# Patient Record
Sex: Female | Born: 1950 | Race: White | Hispanic: No | Marital: Married | Smoking: Former smoker
Health system: Southern US, Community
[De-identification: ages and names within clinical notes are randomized; demographics above are authoritative.]

## PROBLEM LIST (undated history)

## (undated) DIAGNOSIS — I341 Nonrheumatic mitral (valve) prolapse: Secondary | ICD-10-CM

## (undated) DIAGNOSIS — R87619 Unspecified abnormal cytological findings in specimens from cervix uteri: Secondary | ICD-10-CM

## (undated) DIAGNOSIS — IMO0002 Reserved for concepts with insufficient information to code with codable children: Secondary | ICD-10-CM

## (undated) HISTORY — PX: COLONOSCOPY: SHX174

## (undated) HISTORY — PX: ABDOMINAL HYSTERECTOMY: SHX81

## (undated) HISTORY — PX: DILATATION & CURRETTAGE/HYSTEROSCOPY WITH RESECTOCOPE: SHX5572

## (undated) HISTORY — PX: BUNIONECTOMY: SHX129

## (undated) HISTORY — PX: DILATION AND CURETTAGE OF UTERUS: SHX78

---

## 1998-06-14 ENCOUNTER — Ambulatory Visit (HOSPITAL_COMMUNITY): Admission: RE | Admit: 1998-06-14 | Discharge: 1998-06-14 | Payer: Self-pay | Admitting: Obstetrics & Gynecology

## 1999-06-21 ENCOUNTER — Ambulatory Visit (HOSPITAL_COMMUNITY): Admission: RE | Admit: 1999-06-21 | Discharge: 1999-06-21 | Payer: Self-pay | Admitting: Obstetrics & Gynecology

## 1999-06-21 ENCOUNTER — Encounter: Payer: Self-pay | Admitting: Obstetrics & Gynecology

## 1999-06-23 ENCOUNTER — Ambulatory Visit (HOSPITAL_COMMUNITY): Admission: RE | Admit: 1999-06-23 | Discharge: 1999-06-23 | Payer: Self-pay | Admitting: Obstetrics & Gynecology

## 1999-06-23 ENCOUNTER — Encounter: Payer: Self-pay | Admitting: Obstetrics & Gynecology

## 2000-07-30 ENCOUNTER — Encounter: Payer: Self-pay | Admitting: Obstetrics & Gynecology

## 2000-07-30 ENCOUNTER — Ambulatory Visit (HOSPITAL_COMMUNITY): Admission: RE | Admit: 2000-07-30 | Discharge: 2000-07-30 | Payer: Self-pay | Admitting: Obstetrics & Gynecology

## 2000-09-18 ENCOUNTER — Other Ambulatory Visit: Admission: RE | Admit: 2000-09-18 | Discharge: 2000-09-18 | Payer: Self-pay | Admitting: Obstetrics & Gynecology

## 2001-08-01 ENCOUNTER — Encounter: Payer: Self-pay | Admitting: Obstetrics & Gynecology

## 2001-08-01 ENCOUNTER — Ambulatory Visit (HOSPITAL_COMMUNITY): Admission: RE | Admit: 2001-08-01 | Discharge: 2001-08-01 | Payer: Self-pay | Admitting: Obstetrics & Gynecology

## 2001-08-07 ENCOUNTER — Encounter: Payer: Self-pay | Admitting: Obstetrics & Gynecology

## 2001-08-07 ENCOUNTER — Encounter: Admission: RE | Admit: 2001-08-07 | Discharge: 2001-08-07 | Payer: Self-pay | Admitting: Obstetrics & Gynecology

## 2002-01-06 ENCOUNTER — Other Ambulatory Visit: Admission: RE | Admit: 2002-01-06 | Discharge: 2002-01-06 | Payer: Self-pay | Admitting: Obstetrics & Gynecology

## 2002-08-11 ENCOUNTER — Encounter: Payer: Self-pay | Admitting: Obstetrics & Gynecology

## 2002-08-11 ENCOUNTER — Ambulatory Visit (HOSPITAL_COMMUNITY): Admission: RE | Admit: 2002-08-11 | Discharge: 2002-08-11 | Payer: Self-pay | Admitting: Obstetrics & Gynecology

## 2003-01-01 ENCOUNTER — Encounter: Admission: RE | Admit: 2003-01-01 | Discharge: 2003-01-01 | Payer: Self-pay | Admitting: Obstetrics & Gynecology

## 2003-01-01 ENCOUNTER — Encounter: Payer: Self-pay | Admitting: Obstetrics & Gynecology

## 2003-01-27 ENCOUNTER — Other Ambulatory Visit: Admission: RE | Admit: 2003-01-27 | Discharge: 2003-01-27 | Payer: Self-pay | Admitting: Obstetrics & Gynecology

## 2003-08-19 ENCOUNTER — Ambulatory Visit (HOSPITAL_COMMUNITY): Admission: RE | Admit: 2003-08-19 | Discharge: 2003-08-19 | Payer: Self-pay | Admitting: Obstetrics & Gynecology

## 2004-01-27 ENCOUNTER — Other Ambulatory Visit: Admission: RE | Admit: 2004-01-27 | Discharge: 2004-01-27 | Payer: Self-pay | Admitting: Obstetrics & Gynecology

## 2004-09-12 ENCOUNTER — Ambulatory Visit (HOSPITAL_COMMUNITY): Admission: RE | Admit: 2004-09-12 | Discharge: 2004-09-12 | Payer: Self-pay | Admitting: Obstetrics & Gynecology

## 2005-02-13 ENCOUNTER — Other Ambulatory Visit: Admission: RE | Admit: 2005-02-13 | Discharge: 2005-02-13 | Payer: Self-pay | Admitting: Obstetrics & Gynecology

## 2005-02-28 ENCOUNTER — Encounter (INDEPENDENT_AMBULATORY_CARE_PROVIDER_SITE_OTHER): Payer: Self-pay | Admitting: *Deleted

## 2005-02-28 ENCOUNTER — Ambulatory Visit (HOSPITAL_COMMUNITY): Admission: RE | Admit: 2005-02-28 | Discharge: 2005-02-28 | Payer: Self-pay | Admitting: Obstetrics & Gynecology

## 2005-09-14 ENCOUNTER — Ambulatory Visit (HOSPITAL_COMMUNITY): Admission: RE | Admit: 2005-09-14 | Discharge: 2005-09-14 | Payer: Self-pay | Admitting: Obstetrics & Gynecology

## 2006-09-17 ENCOUNTER — Ambulatory Visit (HOSPITAL_COMMUNITY): Admission: RE | Admit: 2006-09-17 | Discharge: 2006-09-17 | Payer: Self-pay | Admitting: Obstetrics & Gynecology

## 2007-09-23 ENCOUNTER — Ambulatory Visit (HOSPITAL_COMMUNITY): Admission: RE | Admit: 2007-09-23 | Discharge: 2007-09-23 | Payer: Self-pay | Admitting: Obstetrics & Gynecology

## 2008-09-23 ENCOUNTER — Ambulatory Visit (HOSPITAL_COMMUNITY): Admission: RE | Admit: 2008-09-23 | Discharge: 2008-09-23 | Payer: Self-pay | Admitting: Obstetrics & Gynecology

## 2009-07-17 IMAGING — MG MM DIGITAL SCREENING BILAT
4 series · 4 of 4 positions shown · non-contrast
Comparison: none

DG SCREEN MAMMOGRAM BILATERAL
Bilateral CC and MLO view(s) were taken.

DIGITAL SCREENING MAMMOGRAM WITH CAD:
The breast tissue is heterogeneously dense.  No masses or malignant type calcifications are 
identified.  Compared with prior studies.

[R CC]
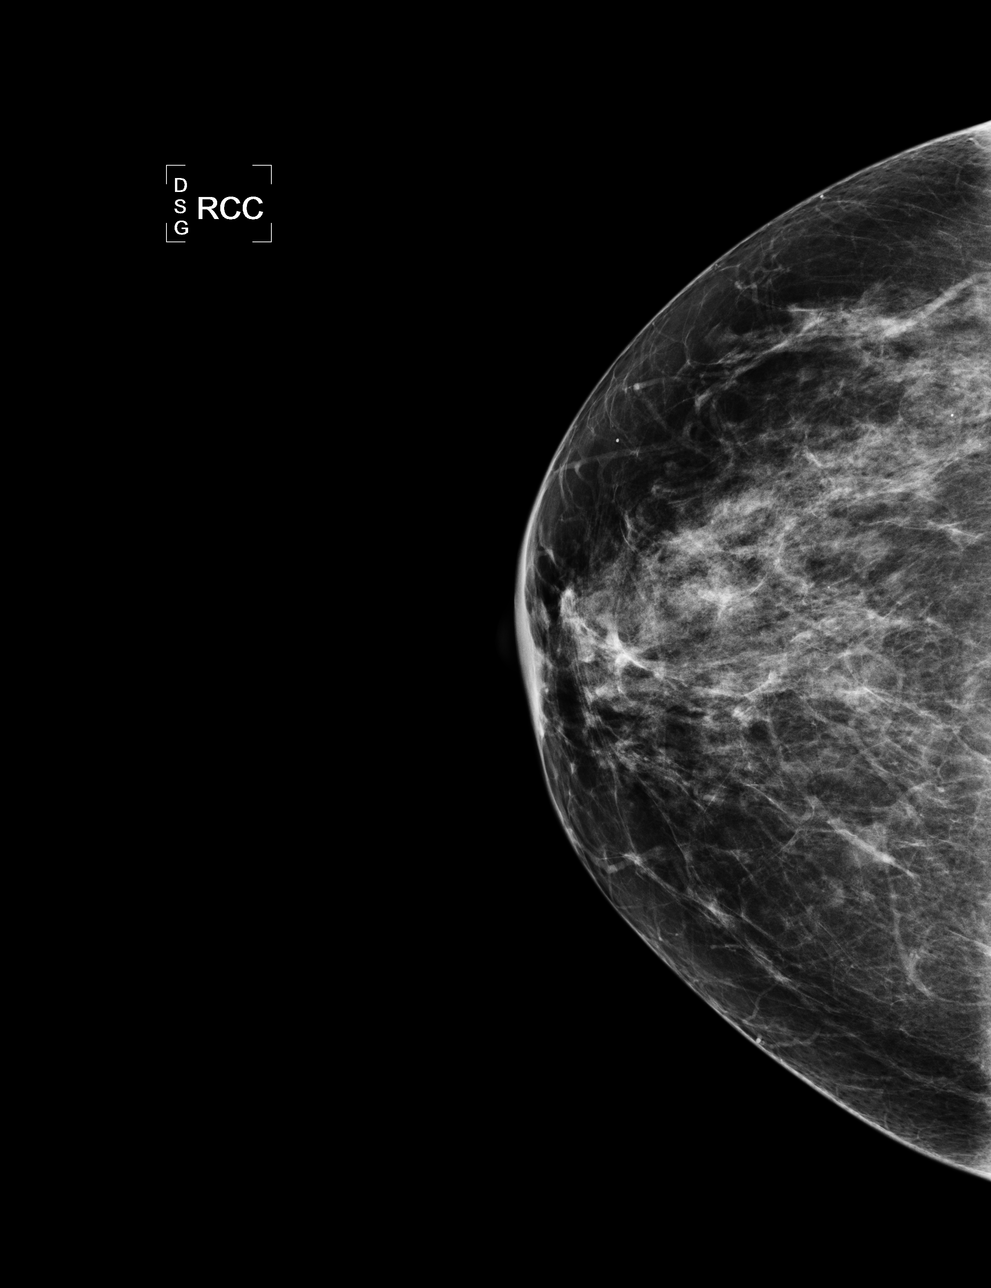

[R MLO]
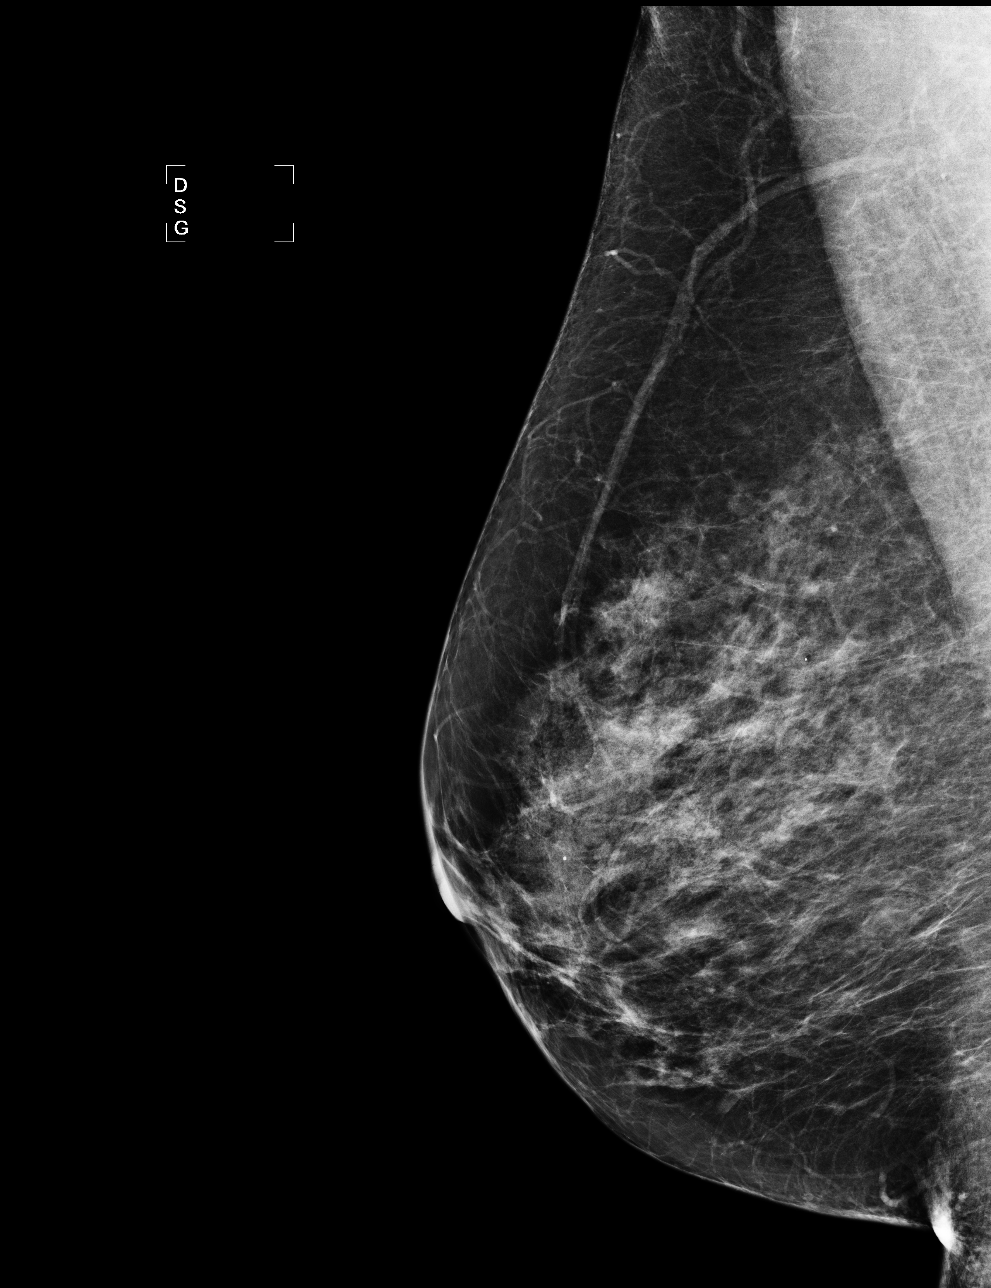

[L CC]
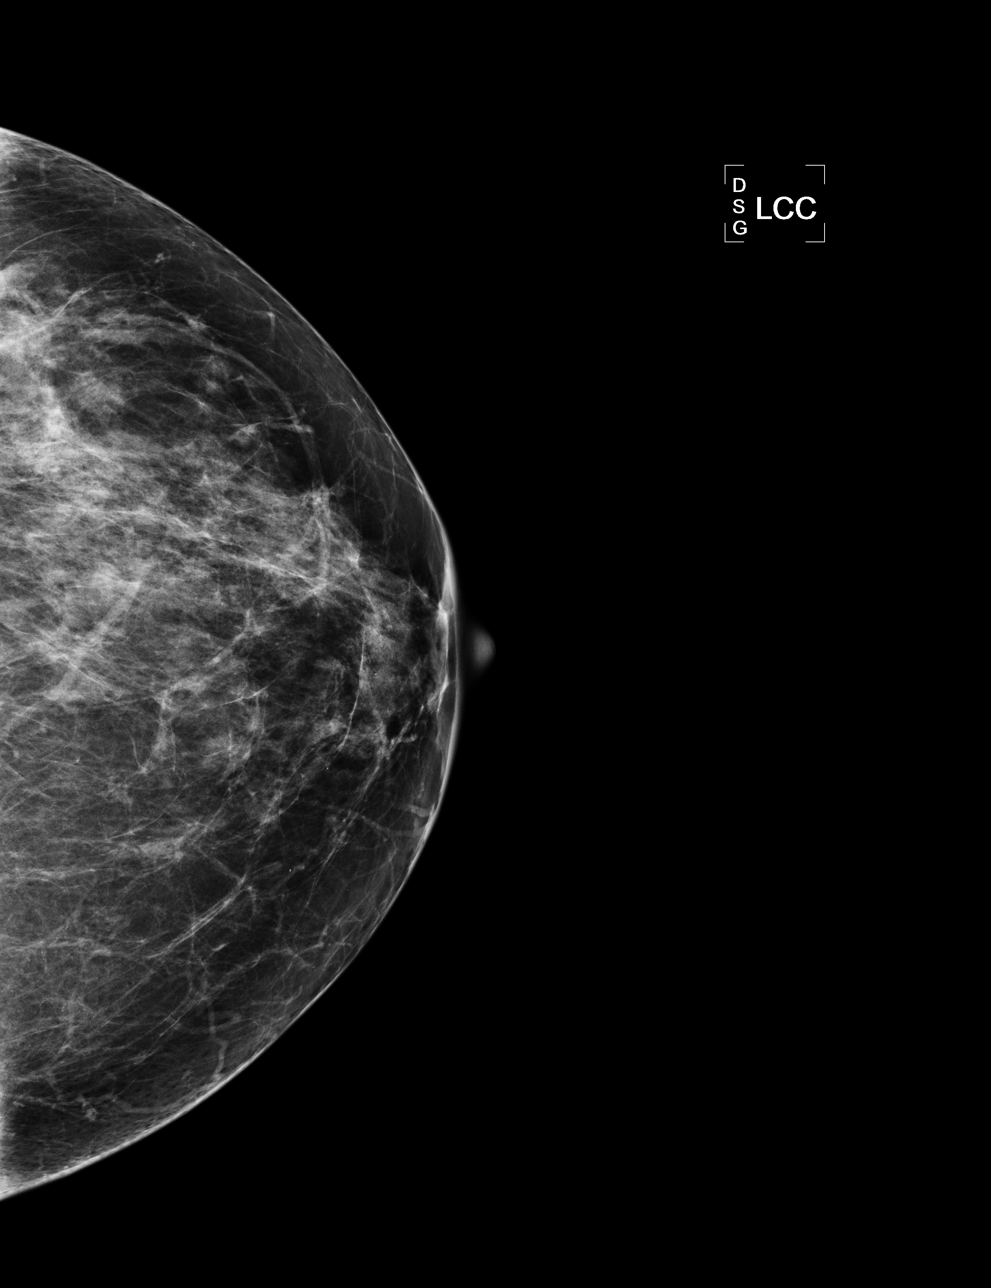

[L MLO]
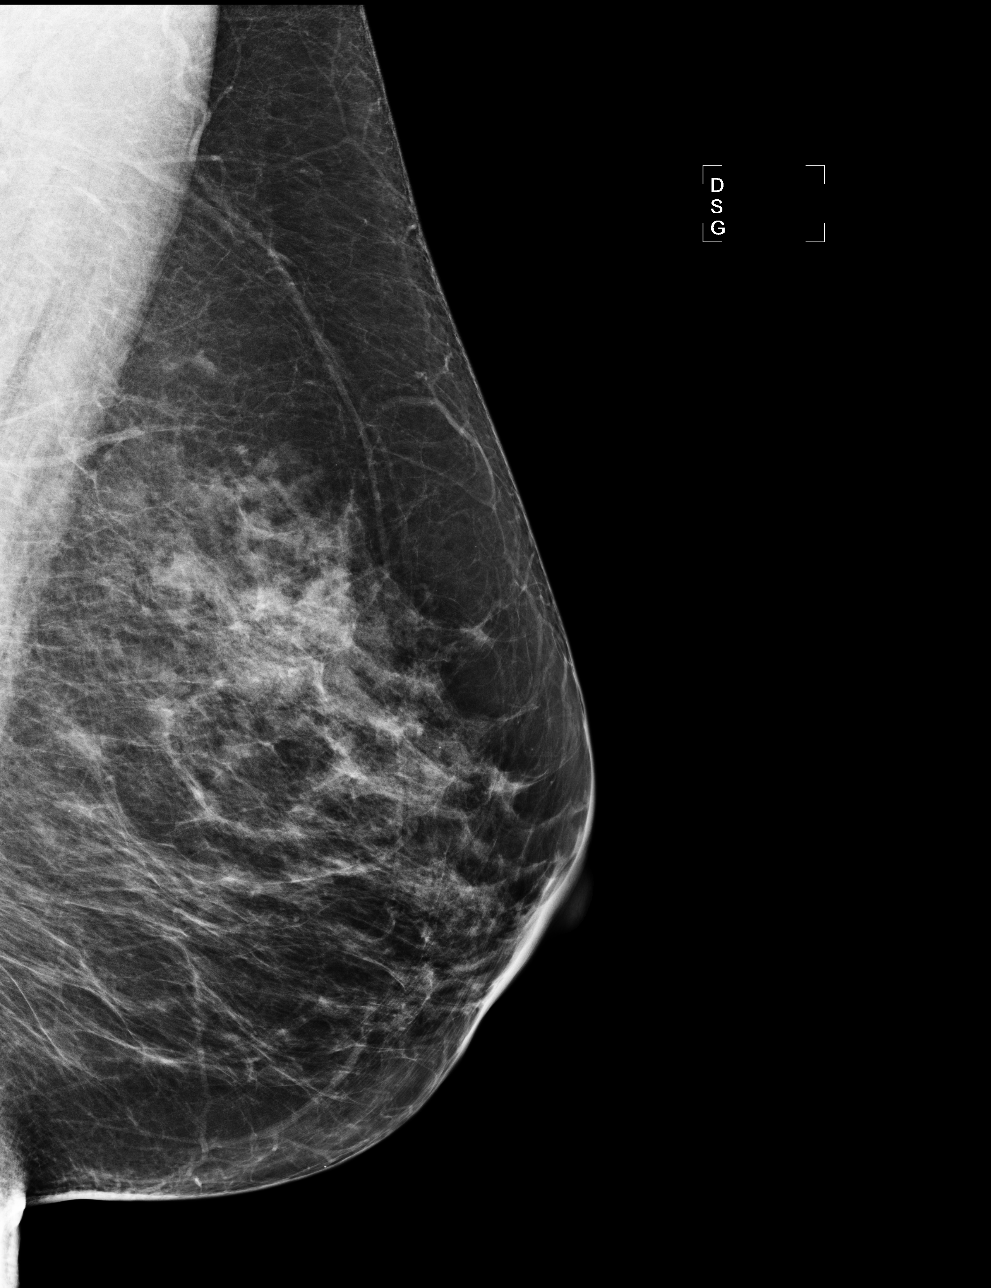

[4 of 4 positions shown; findings below may reference images not displayed]

IMPRESSION: No specific mammographic evidence of malignancy.  Next screening mammogram is recommended in one 
year.

ASSESSMENT: Negative - BI-RADS 1

Screening mammogram in 1 year.
ANALYZED BY COMPUTER AIDED DETECTION. , THIS PROCEDURE WAS A DIGITAL MAMMOGRAM.

## 2011-03-03 NOTE — Op Note (Signed)
NAME:  Terry Vance, Terry Vance NO.:  [id number]   MEDICAL RECORD NO.:  [id number]          PATIENT TYPE:  AMB   LOCATION:  SDC                           FACILITY:  [facility]   PHYSICIAN:  Freddy Finner, M.D.   DATE OF BIRTH:  15-Dec-1950   DATE OF PROCEDURE:  02/28/2005  DATE OF DISCHARGE:                                 OPERATIVE REPORT   PREOPERATIVE DIAGNOSES:  1.  Post menopausal bleeding.  2.  Hormone replacement therapy.  3.  Endometrial polyp.   POSTOPERATIVE DIAGNOSES:  1.  Post menopausal bleeding.  2.  Hormone replacement therapy.  3.  Endometrial polyp.   OPERATIVE PROCEDURE:  Hysteroscopy, D&C, and resection of polyp.   SURGEON:  Freddy Finner, M.D.   ANESTHESIA:  General.   INTRAOPERATIVE COMPLICATIONS:  None.   ESTIMATED INTRAOPERATIVE BLOOD LOSS:  Was 10 cc.   INTRAOPERATIVE SORBITOL DEFICIT:  Was 120 cc.   The patient is a 60 year old who is on Prempro 0.45/1.5.  She has had an  episode of post menopausal bleeding on this hormonal protocol.  She had a  sonohistogram in the office which showed a 6- to -8-mm endometrial polyp.  She is now admitted for hysteroscopy D&C.  She has a history of mitral valve  prolapse.  She was given Rocephin 1 gram IV preoperatively.   She was brought to the operating room, placed under adequate general  anesthesia, placed in a dorsal lithotomy position using the Ashmore stirrup  system.  Betadine prep of mons perineal and vagina was carried out in the  usual fashion.  Sterile drapes were applied.  Bivalve speculum was  introduced and the cervix was visualized.  Grasp on the anterior lip of the  single-tip tenaculum.  Uterus sounded to 8-cm.  The cervix was progressively  dilated with Hegar dilators to 23.  A 12.5 degree [facility] hysteroscope was  introduced using 3% sorbitol as distending medium.  Inspection of the cavity  revealed an endometrial polyp as suspected by a sonohistogram.  Thorough  curettage was carried out and  exploration __________ forceps removed  adequate endometrial sampling and resection of endometrial polyp.  Re-  inspection with the hysteroscope revealed adequate resection and sampling.  Photographs were made before and after the D&C.  All instruments were  removed.  The patient was awakened and taken to recovery in good condition.   She will be discharged in the immediate postoperative period for followup in  the office in approximately one week.  She is given routine outpatient  surgical instructions.  She is given Tilden Fossa to be taken as needed  for postoperative pain unrelieved by ibuprofen.  She was given Toradol 30 mg  IM, 30 mg IV immediately on completion of the procedure.      WRN/MEDQ  D:  02/28/2005  T:  02/28/2005  Job:  [id number]

## 2012-07-05 ENCOUNTER — Other Ambulatory Visit: Payer: Self-pay | Admitting: Obstetrics & Gynecology

## 2012-08-23 ENCOUNTER — Inpatient Hospital Stay (HOSPITAL_COMMUNITY)
Admission: AD | Admit: 2012-08-23 | Discharge: 2012-08-24 | Disposition: A | Discharge status: Home / Self Care | Payer: BC Managed Care – PPO | Source: Ambulatory Visit | Attending: Obstetrics and Gynecology | Admitting: Obstetrics and Gynecology

## 2012-08-23 ENCOUNTER — Encounter (HOSPITAL_COMMUNITY): Payer: Self-pay

## 2012-08-23 DIAGNOSIS — N950 Postmenopausal bleeding: Secondary | ICD-10-CM

## 2012-08-23 HISTORY — DX: Nonrheumatic mitral (valve) prolapse: I34.1

## 2012-08-23 HISTORY — DX: Unspecified abnormal cytological findings in specimens from cervix uteri: R87.619

## 2012-08-23 HISTORY — DX: Reserved for concepts with insufficient information to code with codable children: IMO0002

## 2012-08-23 LAB — CBC
HCT: 36.2 % (ref 36.0–46.0)
MCHC: 32.9 g/dL (ref 30.0–36.0)
MCV: 83.8 fL (ref 78.0–100.0)
Platelets: 294 10*3/uL (ref 150–400)
RBC: 4.32 MIL/uL (ref 3.87–5.11)
RDW: 13.1 % (ref 11.5–15.5)
WBC: 10.5 10*3/uL (ref 4.0–10.5)

## 2012-08-23 NOTE — MAU Note (Signed)
Vaginal spotting since last week. Heavy amount of bright red bleeding this evening with some clots. Had D&C & hysteroscopy in September for vaginal bleeding (Dr. Jennette Kettle). Lower abdominal cramping.

## 2012-08-23 NOTE — MAU Provider Note (Signed)
Chief Complaint: Vaginal Bleeding  First Provider Initiated Contact with Patient 08/23/12 [id number]     SUBJECTIVE HPI: Terry Vance is a 61 y.o. postmenopausal female who presents to maternity admissions with spotting since last week and heavy amount of bright red bleeding this evening with some clots and mild low abdominal cramping. Dr. Jennette Kettle performed a D&C & hysteroscopy in September for vaginal bleeding. Pathology could not exclude hyperplasia. No evidence of malignancy. Also a postmenopausal bleeding in 2006. Diagnosed with polyp which was removed by Dr. Rock Nephew is not sure of her LMP. Does not think she has ever had an FSH level drawn. Has taken multiple regimens of HRT over the last 7 years, but was on Nuvaring continuously cycled for most of that time until 06/2012. Each estradiol patch and Prometrium at the beginning of this month. Has had several episodes of heavy bleeding over the past few years which are interfering with her quality of life. Patient interested in other management options but does not want a hysterectomy.   Past Medical History  Diagnosis Date  . Abnormal Pap smear     several years ago  . Mitral valve prolapse    OB History    Grav Para Term Preterm Abortions TAB SAB Ect Mult Living                 Past Surgical History  Procedure Date  . Dilation and curettage of uterus   . Dilatation & currettage/hysteroscopy with resectocope    History   Social History  . Marital Status: Married    Spouse Name: N/A    Number of Children: N/A  . Years of Education: N/A   Occupational History  . Not on file.   Social History Main Topics  . Smoking status: Not on file  . Smokeless tobacco: Not on file  . Alcohol Use: Not on file  . Drug Use: Not on file  . Sexually Active: Not on file   Other Topics Concern  . Not on file   Social History Narrative  . No narrative on file   No current facility-administered medications on file prior to encounter.   Current Outpatient  Prescriptions on File Prior to Encounter  Medication Sig Dispense Refill  . estradiol (MINIVELLE) 0.1 MG/24HR Place 1 patch onto the skin 2 (two) times a week.      . progesterone (PROMETRIUM) 100 MG capsule Take 100 mg by mouth daily.       Allergies  Allergen Reactions  . Keflex (Cephalexin) Rash  . Penicillins Rash  . Tylenol (Acetaminophen) Rash    ROS: Pertinent items in HPI  OBJECTIVE Blood pressure 128/77, pulse 92, temperature 97.6 F (36.4 C), temperature source Oral, resp. rate 18. GENERAL: Well-developed, well-nourished female in no acute distress.  HEENT: Normocephalic HEART: normal rate RESP: normal effort ABDOMEN: Soft, non-tender EXTREMITIES: Nontender, no edema NEURO: Alert and oriented SPECULUM EXAM: NEFG, moderate amount of bright red blood and one small clot noted in the vault, small amount of active bleeding from the cervix. 0.5 cm mass at 8:00 in cervical os, nonfriable. BIMANUAL: uterus normal size, no adnexal tenderness or masses  LAB RESULTS Results for orders placed during the hospital encounter of 08/23/12 (from the past 24 hour(s))  CBC     Status: Abnormal   Collection Time   08/23/12 10:47 PM      Component Value Range   WBC 10.5  4.0 - 10.5 K/uL   RBC 4.32  3.87 - 5.11  MIL/uL   Hemoglobin 11.9 (*) 12.0 - 15.0 g/dL   HCT 95.6  21.3 - 08.6 %   MCV 83.8  78.0 - 100.0 fL   MCH 27.5  26.0 - 34.0 pg   MCHC 32.9  30.0 - 36.0 g/dL   RDW 57.8  46.9 - 62.9 %   Platelets 294  150 - 400 K/uL    IMAGING  MAU COURSE  ASSESSMENT 1.  Presumed Postmenopausal bleeding. Postmenopausal state not verified.     PLAN Discharge home per consult with Dr. Marcelle Overlie. Continue current HRT regimen. Discussed modification with Dr. Jennette Kettle in the office. Bleeding precautions Information on causes and treatment of postmenopausal bleeding given     Follow-up Information    Follow up with Freddy Finner, MD. Call on 08/26/2012.   Contact information:   [address] [place] [address] [phone]       Follow up with [facility]ERN[facility] AD[facility]. (As needed if symptoms worsen)    Contact information:   [address] [id number] mc [place] [address] [phone]          Medication List     As of 08/24/2012 12:12 AM    TAKE these medications         MINIVELLE 0.1 MG/24HR   Generic drug: estradiol   Place 1 patch onto the skin 2 (two) times a week.      progesterone 100 MG capsule   Commonly known as: PROMETRIUM   Take 100 mg by mouth daily.        Dorathy Kinsman, CNM 08/24/2012  12:12 AM

## 2012-08-24 NOTE — Discharge Instructions (Signed)
Postmenopausal Bleeding Menopause is commonly referred to as the "change in life." It is a time when the fertile years, the time of ovulating and having menstrual periods, has come to an end. It is also determined by not having menstrual periods for 12 months.  Postmenopausal bleeding is any bleeding a woman has after she has entered into menopause. Any type of postmenopausal bleeding, even if it appears to be a typical menstrual period, is concerning. This should be evaluated by your caregiver.  CAUSES   Hormone therapy.  Cancer of the cervix or cancer of the lining of the uterus (endometrial cancer).  Thinning of the uterine lining (uterine atrophy).  Thyroid diseases.  Certain medicines.  Infection of the uterus or cervix.  Inflammation or irritation of the uterine lining (endometritis).  Estrogen-secreting tumors.  Growths (polyps) on the cervix, uterine lining, or uterus.  Uterine tumors (fibroids).  Being very overweight (obese). DIAGNOSIS  Your caregiver will take a medical history and ask questions. A physical exam will also be performed. Further tests may include:   A transvaginal ultrasound. An ultrasound wand or probe is inserted into your vagina to view the pelvic organs.  A biopsy of the lining of the uterus (endometrium). A sample of the endometrium is removed and examined.  A hysteroscopy. Your caregiver may use an instrument with a light and a camera attached to it (hysteroscope). The hysteroscope is used to look inside the uterus for problems.  A dilation and curettage (D&C). Tissue is removed from the uterine lining to be examined for problems. TREATMENT  Treatment depends on the cause of the bleeding. Some treatments include:   Surgery.  Medicines.  Hormones.  A hysteroscopy or D&C to remove polyps or fibroids.  Changing or stopping a current medicine you are taking. Talk to your caregiver about your specific treatment. HOME CARE INSTRUCTIONS    Maintain a healthy weight.  Keep regular pelvic exams and Pap tests. SEEK MEDICAL CARE IF:   You have bleeding, even if it is light in comparison to your previous periods.  Your bleeding lasts more than 1 week.  You have abdominal pain.  You develop bleeding with sexual intercourse. SEEK IMMEDIATE MEDICAL CARE IF:   You have a fever, chills, headache, dizziness, muscle aches, and bleeding.  You have severe pain with bleeding.  You are passing blood clots.  You have bleeding and need more than 1 pad an hour.  You feel faint. MAKE SURE YOU:  Understand these instructions.  Will watch your condition.  Will get help right away if you are not doing well or get worse. Document Released: 01/10/2006 Document Revised: 12/25/2011 Document Reviewed: 06/08/2011 [facility] Patient Information 2013 [place], [place]. Postmenopausal Bleeding Menopause is commonly referred to as the "change in life." It is a time when the fertile years, the time of ovulating and having menstrual periods, has come to an end. It is also determined by not having menstrual periods for 12 months.  Postmenopausal bleeding is any bleeding a woman has after she has entered into menopause. Any type of postmenopausal bleeding, even if it appears to be a typical menstrual period, is concerning. This should be evaluated by your caregiver.  CAUSES   Hormone therapy.  Cancer of the cervix or cancer of the lining of the uterus (endometrial cancer).  Thinning of the uterine lining (uterine atrophy).  Thyroid diseases.  Certain medicines.  Infection of the uterus or cervix.  Inflammation or irritation of the uterine lining (endometritis).  Estrogen-secreting tumors.  Growths (polyps) on the cervix, uterine lining, or uterus.  Uterine tumors (fibroids).  Being very overweight (obese). DIAGNOSIS  Your caregiver will take a medical history and ask questions. A physical exam will also be performed. Further  tests may include:   A transvaginal ultrasound. An ultrasound wand or probe is inserted into your vagina to view the pelvic organs.  A biopsy of the lining of the uterus (endometrium). A sample of the endometrium is removed and examined.  A hysteroscopy. Your caregiver may use an instrument with a light and a camera attached to it (hysteroscope). The hysteroscope is used to look inside the uterus for problems.  A dilation and curettage (D&C). Tissue is removed from the uterine lining to be examined for problems. TREATMENT  Treatment depends on the cause of the bleeding. Some treatments include:   Surgery.  Medicines.  Hormones.  A hysteroscopy or D&C to remove polyps or fibroids.  Changing or stopping a current medicine you are taking. Talk to your caregiver about your specific treatment. HOME CARE INSTRUCTIONS   Maintain a healthy weight.  Keep regular pelvic exams and Pap tests. SEEK MEDICAL CARE IF:   You have bleeding, even if it is light in comparison to your previous periods.  Your bleeding lasts more than 1 week.  You have abdominal pain.  You develop bleeding with sexual intercourse. SEEK IMMEDIATE MEDICAL CARE IF:   You have a fever, chills, headache, dizziness, muscle aches, and bleeding.  You have severe pain with bleeding.  You are passing blood clots.  You have bleeding and need more than 1 pad an hour.  You feel faint. MAKE SURE YOU:  Understand these instructions.  Will watch your condition.  Will get help right away if you are not doing well or get worse. Document Released: 01/10/2006 Document Revised: 12/25/2011 Document Reviewed: 06/08/2011 [facility] Patient Information 2013 [place], [place].

## 2012-08-30 ENCOUNTER — Other Ambulatory Visit: Payer: Self-pay | Admitting: Obstetrics & Gynecology

## 2014-11-24 ENCOUNTER — Other Ambulatory Visit: Payer: Self-pay | Admitting: Obstetrics & Gynecology

## 2014-11-25 LAB — CYTOLOGY - PAP

## 2014-11-30 ENCOUNTER — Other Ambulatory Visit: Payer: Self-pay | Admitting: Dermatology

## 2018-04-03 DIAGNOSIS — R350 Frequency of micturition: Secondary | ICD-10-CM | POA: Diagnosis not present

## 2018-04-03 DIAGNOSIS — N819 Female genital prolapse, unspecified: Secondary | ICD-10-CM | POA: Diagnosis not present

## 2018-04-03 DIAGNOSIS — N3945 Continuous leakage: Secondary | ICD-10-CM | POA: Diagnosis not present

## 2018-04-29 DIAGNOSIS — Z6822 Body mass index (BMI) 22.0-22.9, adult: Secondary | ICD-10-CM | POA: Diagnosis not present

## 2018-04-29 DIAGNOSIS — Z713 Dietary counseling and surveillance: Secondary | ICD-10-CM | POA: Diagnosis not present

## 2018-04-29 DIAGNOSIS — Z299 Encounter for prophylactic measures, unspecified: Secondary | ICD-10-CM | POA: Diagnosis not present

## 2018-04-29 DIAGNOSIS — H6690 Otitis media, unspecified, unspecified ear: Secondary | ICD-10-CM | POA: Diagnosis not present

## 2018-05-01 DIAGNOSIS — K921 Melena: Secondary | ICD-10-CM | POA: Diagnosis not present

## 2018-05-01 DIAGNOSIS — Z800 Family history of malignant neoplasm of digestive organs: Secondary | ICD-10-CM | POA: Diagnosis not present

## 2018-05-27 DIAGNOSIS — Z1211 Encounter for screening for malignant neoplasm of colon: Secondary | ICD-10-CM | POA: Diagnosis not present

## 2018-05-27 DIAGNOSIS — Z800 Family history of malignant neoplasm of digestive organs: Secondary | ICD-10-CM | POA: Diagnosis not present

## 2018-05-27 DIAGNOSIS — K5730 Diverticulosis of large intestine without perforation or abscess without bleeding: Secondary | ICD-10-CM | POA: Diagnosis not present

## 2018-05-27 DIAGNOSIS — K648 Other hemorrhoids: Secondary | ICD-10-CM | POA: Diagnosis not present

## 2018-07-23 DIAGNOSIS — Z23 Encounter for immunization: Secondary | ICD-10-CM | POA: Diagnosis not present

## 2018-08-07 DIAGNOSIS — Z1339 Encounter for screening examination for other mental health and behavioral disorders: Secondary | ICD-10-CM | POA: Diagnosis not present

## 2018-08-07 DIAGNOSIS — Z1331 Encounter for screening for depression: Secondary | ICD-10-CM | POA: Diagnosis not present

## 2018-08-07 DIAGNOSIS — Z6822 Body mass index (BMI) 22.0-22.9, adult: Secondary | ICD-10-CM | POA: Diagnosis not present

## 2018-08-07 DIAGNOSIS — E7800 Pure hypercholesterolemia, unspecified: Secondary | ICD-10-CM | POA: Diagnosis not present

## 2018-08-07 DIAGNOSIS — Z299 Encounter for prophylactic measures, unspecified: Secondary | ICD-10-CM | POA: Diagnosis not present

## 2018-08-07 DIAGNOSIS — R5383 Other fatigue: Secondary | ICD-10-CM | POA: Diagnosis not present

## 2018-08-07 DIAGNOSIS — Z0000 Encounter for general adult medical examination without abnormal findings: Secondary | ICD-10-CM | POA: Diagnosis not present

## 2018-08-07 DIAGNOSIS — Z7189 Other specified counseling: Secondary | ICD-10-CM | POA: Diagnosis not present

## 2018-10-10 DIAGNOSIS — J069 Acute upper respiratory infection, unspecified: Secondary | ICD-10-CM | POA: Diagnosis not present

## 2018-10-10 DIAGNOSIS — Z299 Encounter for prophylactic measures, unspecified: Secondary | ICD-10-CM | POA: Diagnosis not present

## 2018-10-10 DIAGNOSIS — Z6821 Body mass index (BMI) 21.0-21.9, adult: Secondary | ICD-10-CM | POA: Diagnosis not present

## 2018-11-27 DIAGNOSIS — N819 Female genital prolapse, unspecified: Secondary | ICD-10-CM | POA: Diagnosis not present

## 2018-12-03 DIAGNOSIS — S59909A Unspecified injury of unspecified elbow, initial encounter: Secondary | ICD-10-CM | POA: Diagnosis not present

## 2018-12-03 DIAGNOSIS — Z299 Encounter for prophylactic measures, unspecified: Secondary | ICD-10-CM | POA: Diagnosis not present

## 2018-12-03 DIAGNOSIS — Z6821 Body mass index (BMI) 21.0-21.9, adult: Secondary | ICD-10-CM | POA: Diagnosis not present

## 2019-02-17 DIAGNOSIS — N8189 Other female genital prolapse: Secondary | ICD-10-CM | POA: Diagnosis not present

## 2019-03-17 DIAGNOSIS — Z01419 Encounter for gynecological examination (general) (routine) without abnormal findings: Secondary | ICD-10-CM | POA: Diagnosis not present

## 2019-03-17 DIAGNOSIS — Z6823 Body mass index (BMI) 23.0-23.9, adult: Secondary | ICD-10-CM | POA: Diagnosis not present

## 2019-03-17 DIAGNOSIS — Z1231 Encounter for screening mammogram for malignant neoplasm of breast: Secondary | ICD-10-CM | POA: Diagnosis not present

## 2019-03-20 DIAGNOSIS — Z299 Encounter for prophylactic measures, unspecified: Secondary | ICD-10-CM | POA: Diagnosis not present

## 2019-03-20 DIAGNOSIS — Z6822 Body mass index (BMI) 22.0-22.9, adult: Secondary | ICD-10-CM | POA: Diagnosis not present

## 2019-03-20 DIAGNOSIS — F419 Anxiety disorder, unspecified: Secondary | ICD-10-CM | POA: Diagnosis not present

## 2019-03-20 DIAGNOSIS — M25559 Pain in unspecified hip: Secondary | ICD-10-CM | POA: Diagnosis not present

## 2019-03-20 DIAGNOSIS — Z713 Dietary counseling and surveillance: Secondary | ICD-10-CM | POA: Diagnosis not present

## 2019-05-08 DIAGNOSIS — Z01818 Encounter for other preprocedural examination: Secondary | ICD-10-CM | POA: Diagnosis not present

## 2019-05-08 DIAGNOSIS — N819 Female genital prolapse, unspecified: Secondary | ICD-10-CM | POA: Diagnosis not present

## 2019-05-19 DIAGNOSIS — N816 Rectocele: Secondary | ICD-10-CM | POA: Diagnosis not present

## 2019-05-19 DIAGNOSIS — N8110 Cystocele, unspecified: Secondary | ICD-10-CM | POA: Diagnosis not present

## 2019-05-29 DIAGNOSIS — Z09 Encounter for follow-up examination after completed treatment for conditions other than malignant neoplasm: Secondary | ICD-10-CM | POA: Diagnosis not present

## 2019-06-09 ENCOUNTER — Ambulatory Visit (HOSPITAL_BASED_OUTPATIENT_CLINIC_OR_DEPARTMENT_OTHER): Admit: 2019-06-09 | Payer: Self-pay | Admitting: Obstetrics & Gynecology

## 2019-06-09 ENCOUNTER — Encounter (HOSPITAL_BASED_OUTPATIENT_CLINIC_OR_DEPARTMENT_OTHER): Payer: Self-pay

## 2019-06-09 SURGERY — ANTERIOR AND POSTERIOR REPAIR WITH SACROSPINOUS FIXATION
Anesthesia: General

## 2019-08-12 DIAGNOSIS — H43393 Other vitreous opacities, bilateral: Secondary | ICD-10-CM | POA: Diagnosis not present

## 2019-08-12 DIAGNOSIS — H2513 Age-related nuclear cataract, bilateral: Secondary | ICD-10-CM | POA: Diagnosis not present

## 2019-08-20 DIAGNOSIS — Z23 Encounter for immunization: Secondary | ICD-10-CM | POA: Diagnosis not present

## 2019-09-10 DIAGNOSIS — F419 Anxiety disorder, unspecified: Secondary | ICD-10-CM | POA: Diagnosis not present

## 2019-09-10 DIAGNOSIS — B001 Herpesviral vesicular dermatitis: Secondary | ICD-10-CM | POA: Diagnosis not present

## 2019-09-10 DIAGNOSIS — L259 Unspecified contact dermatitis, unspecified cause: Secondary | ICD-10-CM | POA: Diagnosis not present

## 2019-09-10 DIAGNOSIS — Z6821 Body mass index (BMI) 21.0-21.9, adult: Secondary | ICD-10-CM | POA: Diagnosis not present

## 2019-09-10 DIAGNOSIS — Z299 Encounter for prophylactic measures, unspecified: Secondary | ICD-10-CM | POA: Diagnosis not present

## 2019-12-22 DIAGNOSIS — L219 Seborrheic dermatitis, unspecified: Secondary | ICD-10-CM | POA: Diagnosis not present

## 2019-12-22 DIAGNOSIS — R202 Paresthesia of skin: Secondary | ICD-10-CM | POA: Diagnosis not present

## 2020-01-20 ENCOUNTER — Encounter: Payer: Self-pay | Admitting: Dermatology

## 2020-01-20 ENCOUNTER — Ambulatory Visit: Payer: Medicare Other | Admitting: Dermatology

## 2020-01-20 ENCOUNTER — Other Ambulatory Visit: Payer: Self-pay

## 2020-01-20 DIAGNOSIS — L219 Seborrheic dermatitis, unspecified: Secondary | ICD-10-CM

## 2020-01-20 MED ORDER — HYDROCORTISONE 2.5 % EX OINT
TOPICAL_OINTMENT | Freq: Two times a day (BID) | CUTANEOUS | 1 refills | Status: DC
Start: 2020-01-20 — End: 2023-06-25

## 2020-01-20 NOTE — Progress Notes (Signed)
   Follow-Up Visit   Subjective  Terry Vance is a 69 y.o. female who presents for the following: Skin Problem (both eyelids-scaly, dry and itchy for 2 weeks). Scalp itching and new rash on eyelids Location: Scalp and eyelid Duration: Several weeks Quality: Itching has improved, rash on the eyelid initially on both sides with swelling, now limited to right upper eyelid Associated Signs/Symptoms: Swelling, itch Modifying Factors: Vaseline Severity:  Timing: Similar problem with blepharitis evaluated 7 years ago including patch testing without revealing relevant contact allergy. Context:   The following portions of the chart were reviewed this encounter and updated as appropriate:     Objective  Well appearing patient in no apparent distress; mood and affect are within normal limits.  A focused examination was performed including scalp, ears, face, neck. Relevant physical exam findings are noted in the Assessment and Plan.   Assessment & Plan  Seborrheic dermatitis Right Supraorbital Region  hydrocortisone 2.5 % ointment - Right Supraorbital Region

## 2020-03-24 DIAGNOSIS — Z6822 Body mass index (BMI) 22.0-22.9, adult: Secondary | ICD-10-CM | POA: Diagnosis not present

## 2020-03-24 DIAGNOSIS — Z1231 Encounter for screening mammogram for malignant neoplasm of breast: Secondary | ICD-10-CM | POA: Diagnosis not present

## 2020-05-28 DIAGNOSIS — L239 Allergic contact dermatitis, unspecified cause: Secondary | ICD-10-CM | POA: Diagnosis not present

## 2020-05-28 DIAGNOSIS — Z713 Dietary counseling and surveillance: Secondary | ICD-10-CM | POA: Diagnosis not present

## 2020-05-28 DIAGNOSIS — Z6820 Body mass index (BMI) 20.0-20.9, adult: Secondary | ICD-10-CM | POA: Diagnosis not present

## 2020-05-28 DIAGNOSIS — Z299 Encounter for prophylactic measures, unspecified: Secondary | ICD-10-CM | POA: Diagnosis not present

## 2020-05-28 DIAGNOSIS — E7800 Pure hypercholesterolemia, unspecified: Secondary | ICD-10-CM | POA: Diagnosis not present

## 2020-06-04 DIAGNOSIS — J301 Allergic rhinitis due to pollen: Secondary | ICD-10-CM | POA: Diagnosis not present

## 2020-06-16 DIAGNOSIS — R21 Rash and other nonspecific skin eruption: Secondary | ICD-10-CM | POA: Diagnosis not present

## 2020-06-16 DIAGNOSIS — Z299 Encounter for prophylactic measures, unspecified: Secondary | ICD-10-CM | POA: Diagnosis not present

## 2020-07-02 DIAGNOSIS — Z79899 Other long term (current) drug therapy: Secondary | ICD-10-CM | POA: Diagnosis not present

## 2020-07-02 DIAGNOSIS — E7800 Pure hypercholesterolemia, unspecified: Secondary | ICD-10-CM | POA: Diagnosis not present

## 2020-07-02 DIAGNOSIS — Z299 Encounter for prophylactic measures, unspecified: Secondary | ICD-10-CM | POA: Diagnosis not present

## 2020-07-02 DIAGNOSIS — Z7189 Other specified counseling: Secondary | ICD-10-CM | POA: Diagnosis not present

## 2020-07-02 DIAGNOSIS — R5383 Other fatigue: Secondary | ICD-10-CM | POA: Diagnosis not present

## 2020-07-02 DIAGNOSIS — Z0000 Encounter for general adult medical examination without abnormal findings: Secondary | ICD-10-CM | POA: Diagnosis not present

## 2021-01-17 ENCOUNTER — Ambulatory Visit: Payer: Medicare Other | Admitting: Dermatology

## 2021-01-17 ENCOUNTER — Other Ambulatory Visit: Payer: Self-pay

## 2021-01-17 ENCOUNTER — Encounter: Payer: Self-pay | Admitting: Dermatology

## 2021-01-17 DIAGNOSIS — R208 Other disturbances of skin sensation: Secondary | ICD-10-CM | POA: Diagnosis not present

## 2021-01-17 NOTE — Patient Instructions (Addendum)
Gabapentin. Gave patient sample of CeraVe itch lotion.

## 2021-01-30 ENCOUNTER — Encounter: Payer: Self-pay | Admitting: Dermatology

## 2021-01-30 NOTE — Progress Notes (Signed)
   Follow-Up Visit   Subjective  Terry Vance is a 70 y.o. female who presents for the following: Rash (Arms, back, legs & chest x 1 year on and off- +itch tx- benadryl gel- helps some).  Itching in multiple areas Location:  Duration:  Quality:  Associated Signs/Symptoms: Modifying Factors:  Severity:  Timing: Context: General health unaffected and stable  Objective  Well appearing patient in no apparent distress; mood and affect are within normal limits. Objective  Left Forearm - Anterior, Mid Back, Neck - Posterior, Right Forearm - Anterior: [place] notes areas with primary itching without visible rash, so I favor dysesthesia over dermatitis.  I can find no recent screening laboratory studies on epic so she will check with her primary care physician to make sure there is no underlying systemic reason for pruritus.    All sun exposed areas plus back examined.   Assessment & Plan    Dysesthesia (4) Neck - Posterior; Left Forearm - Anterior; Right Forearm - Anterior; Mid Back  Discussed gabapentin 100mg  weekly to be phoned in at later date.  May use a topical noncortisone antipruritic with the ingredient pramoxine for symptomatic relief.  I would prefer this to topical Benadryl which occasionally causes contact allergic dermatitis.      I, , MD, have reviewed all documentation for this visit.  The documentation on 01/30/21 for the exam, diagnosis, procedures, and orders are all accurate and complete.

## 2021-02-02 DIAGNOSIS — E7800 Pure hypercholesterolemia, unspecified: Secondary | ICD-10-CM | POA: Diagnosis not present

## 2021-02-02 DIAGNOSIS — Z299 Encounter for prophylactic measures, unspecified: Secondary | ICD-10-CM | POA: Diagnosis not present

## 2021-02-02 DIAGNOSIS — R208 Other disturbances of skin sensation: Secondary | ICD-10-CM | POA: Diagnosis not present

## 2021-02-02 DIAGNOSIS — R21 Rash and other nonspecific skin eruption: Secondary | ICD-10-CM | POA: Diagnosis not present

## 2021-04-04 DIAGNOSIS — Z6821 Body mass index (BMI) 21.0-21.9, adult: Secondary | ICD-10-CM | POA: Diagnosis not present

## 2021-04-04 DIAGNOSIS — Z1231 Encounter for screening mammogram for malignant neoplasm of breast: Secondary | ICD-10-CM | POA: Diagnosis not present

## 2021-05-11 DIAGNOSIS — L503 Dermatographic urticaria: Secondary | ICD-10-CM | POA: Diagnosis not present

## 2021-05-11 DIAGNOSIS — L218 Other seborrheic dermatitis: Secondary | ICD-10-CM | POA: Diagnosis not present

## 2021-05-11 DIAGNOSIS — J301 Allergic rhinitis due to pollen: Secondary | ICD-10-CM | POA: Diagnosis not present

## 2021-05-11 DIAGNOSIS — J3089 Other allergic rhinitis: Secondary | ICD-10-CM | POA: Diagnosis not present

## 2021-05-17 DIAGNOSIS — H524 Presbyopia: Secondary | ICD-10-CM | POA: Diagnosis not present

## 2021-05-17 DIAGNOSIS — H43393 Other vitreous opacities, bilateral: Secondary | ICD-10-CM | POA: Diagnosis not present

## 2021-06-14 DIAGNOSIS — U071 COVID-19: Secondary | ICD-10-CM | POA: Diagnosis not present

## 2021-07-05 DIAGNOSIS — Z1211 Encounter for screening for malignant neoplasm of colon: Secondary | ICD-10-CM | POA: Diagnosis not present

## 2021-07-05 DIAGNOSIS — R5383 Other fatigue: Secondary | ICD-10-CM | POA: Diagnosis not present

## 2021-07-05 DIAGNOSIS — Z87891 Personal history of nicotine dependence: Secondary | ICD-10-CM | POA: Diagnosis not present

## 2021-07-05 DIAGNOSIS — Z299 Encounter for prophylactic measures, unspecified: Secondary | ICD-10-CM | POA: Diagnosis not present

## 2021-07-05 DIAGNOSIS — E7800 Pure hypercholesterolemia, unspecified: Secondary | ICD-10-CM | POA: Diagnosis not present

## 2021-07-05 DIAGNOSIS — Z6821 Body mass index (BMI) 21.0-21.9, adult: Secondary | ICD-10-CM | POA: Diagnosis not present

## 2021-07-05 DIAGNOSIS — Z79899 Other long term (current) drug therapy: Secondary | ICD-10-CM | POA: Diagnosis not present

## 2021-07-05 DIAGNOSIS — Z0000 Encounter for general adult medical examination without abnormal findings: Secondary | ICD-10-CM | POA: Diagnosis not present

## 2021-07-05 DIAGNOSIS — Z7189 Other specified counseling: Secondary | ICD-10-CM | POA: Diagnosis not present

## 2022-03-03 DIAGNOSIS — R1032 Left lower quadrant pain: Secondary | ICD-10-CM | POA: Diagnosis not present

## 2022-03-03 DIAGNOSIS — Z299 Encounter for prophylactic measures, unspecified: Secondary | ICD-10-CM | POA: Diagnosis not present

## 2022-03-03 DIAGNOSIS — Z789 Other specified health status: Secondary | ICD-10-CM | POA: Diagnosis not present

## 2022-03-09 DIAGNOSIS — R109 Unspecified abdominal pain: Secondary | ICD-10-CM | POA: Diagnosis not present

## 2022-03-09 DIAGNOSIS — Z9289 Personal history of other medical treatment: Secondary | ICD-10-CM | POA: Diagnosis not present

## 2022-03-09 DIAGNOSIS — R1032 Left lower quadrant pain: Secondary | ICD-10-CM | POA: Diagnosis not present

## 2022-05-15 DIAGNOSIS — Z1231 Encounter for screening mammogram for malignant neoplasm of breast: Secondary | ICD-10-CM | POA: Diagnosis not present

## 2022-05-15 DIAGNOSIS — Z6821 Body mass index (BMI) 21.0-21.9, adult: Secondary | ICD-10-CM | POA: Diagnosis not present

## 2022-07-10 DIAGNOSIS — Z713 Dietary counseling and surveillance: Secondary | ICD-10-CM | POA: Diagnosis not present

## 2022-07-10 DIAGNOSIS — E7800 Pure hypercholesterolemia, unspecified: Secondary | ICD-10-CM | POA: Diagnosis not present

## 2022-07-10 DIAGNOSIS — Z299 Encounter for prophylactic measures, unspecified: Secondary | ICD-10-CM | POA: Diagnosis not present

## 2022-07-10 DIAGNOSIS — Z6821 Body mass index (BMI) 21.0-21.9, adult: Secondary | ICD-10-CM | POA: Diagnosis not present

## 2022-07-10 DIAGNOSIS — Z0000 Encounter for general adult medical examination without abnormal findings: Secondary | ICD-10-CM | POA: Diagnosis not present

## 2022-07-10 DIAGNOSIS — Z7189 Other specified counseling: Secondary | ICD-10-CM | POA: Diagnosis not present

## 2022-07-10 DIAGNOSIS — I341 Nonrheumatic mitral (valve) prolapse: Secondary | ICD-10-CM | POA: Diagnosis not present

## 2022-07-10 DIAGNOSIS — J309 Allergic rhinitis, unspecified: Secondary | ICD-10-CM | POA: Diagnosis not present

## 2022-07-10 DIAGNOSIS — Z789 Other specified health status: Secondary | ICD-10-CM | POA: Diagnosis not present

## 2022-08-01 DIAGNOSIS — E7800 Pure hypercholesterolemia, unspecified: Secondary | ICD-10-CM | POA: Diagnosis not present

## 2022-08-01 DIAGNOSIS — Z6820 Body mass index (BMI) 20.0-20.9, adult: Secondary | ICD-10-CM | POA: Diagnosis not present

## 2022-08-01 DIAGNOSIS — Z789 Other specified health status: Secondary | ICD-10-CM | POA: Diagnosis not present

## 2022-08-01 DIAGNOSIS — M25512 Pain in left shoulder: Secondary | ICD-10-CM | POA: Diagnosis not present

## 2022-08-01 DIAGNOSIS — Z299 Encounter for prophylactic measures, unspecified: Secondary | ICD-10-CM | POA: Diagnosis not present

## 2022-08-02 DIAGNOSIS — M25512 Pain in left shoulder: Secondary | ICD-10-CM | POA: Diagnosis not present

## 2022-08-10 DIAGNOSIS — H3540 Unspecified peripheral retinal degeneration: Secondary | ICD-10-CM | POA: Diagnosis not present

## 2022-08-10 DIAGNOSIS — H524 Presbyopia: Secondary | ICD-10-CM | POA: Diagnosis not present

## 2023-03-22 DIAGNOSIS — S0502XA Injury of conjunctiva and corneal abrasion without foreign body, left eye, initial encounter: Secondary | ICD-10-CM | POA: Diagnosis not present

## 2023-03-24 DIAGNOSIS — H10013 Acute follicular conjunctivitis, bilateral: Secondary | ICD-10-CM | POA: Diagnosis not present

## 2023-03-27 DIAGNOSIS — S0501XD Injury of conjunctiva and corneal abrasion without foreign body, right eye, subsequent encounter: Secondary | ICD-10-CM | POA: Diagnosis not present

## 2023-05-21 DIAGNOSIS — Z6821 Body mass index (BMI) 21.0-21.9, adult: Secondary | ICD-10-CM | POA: Diagnosis not present

## 2023-05-21 DIAGNOSIS — Z1231 Encounter for screening mammogram for malignant neoplasm of breast: Secondary | ICD-10-CM | POA: Diagnosis not present

## 2023-05-23 ENCOUNTER — Other Ambulatory Visit: Payer: Self-pay | Admitting: Obstetrics and Gynecology

## 2023-05-23 DIAGNOSIS — R928 Other abnormal and inconclusive findings on diagnostic imaging of breast: Secondary | ICD-10-CM

## 2023-06-04 ENCOUNTER — Ambulatory Visit: Admission: RE | Admit: 2023-06-04 | Payer: Medicare Other | Source: Ambulatory Visit

## 2023-06-04 ENCOUNTER — Ambulatory Visit
Admission: RE | Admit: 2023-06-04 | Discharge: 2023-06-04 | Disposition: A | Discharge status: Home / Self Care | Payer: Medicare Other | Source: Ambulatory Visit | Attending: Obstetrics and Gynecology | Admitting: Obstetrics and Gynecology

## 2023-06-04 DIAGNOSIS — N6002 Solitary cyst of left breast: Secondary | ICD-10-CM | POA: Diagnosis not present

## 2023-06-04 DIAGNOSIS — R928 Other abnormal and inconclusive findings on diagnostic imaging of breast: Secondary | ICD-10-CM

## 2023-06-04 DIAGNOSIS — N6321 Unspecified lump in the left breast, upper outer quadrant: Secondary | ICD-10-CM | POA: Diagnosis not present

## 2023-06-13 ENCOUNTER — Encounter (INDEPENDENT_AMBULATORY_CARE_PROVIDER_SITE_OTHER): Payer: Self-pay | Admitting: *Deleted

## 2023-06-25 ENCOUNTER — Telehealth (INDEPENDENT_AMBULATORY_CARE_PROVIDER_SITE_OTHER): Payer: Self-pay | Admitting: Gastroenterology

## 2023-06-25 NOTE — Telephone Encounter (Signed)
Ok to schedule.  Room 2  Thanks,  Vista Lawman, MD Gastroenterology and Hepatology [facility] Gastroenterology

## 2023-06-25 NOTE — Telephone Encounter (Signed)
Who is your primary care physician: Dr.Vyas  Reasons for the colonoscopy: 5 year recall  Have you had a colonoscopy before?  Yes Nov 2019  Do you have family history of colon cancer? Yes father  Previous colonoscopy with polyps removed? No  Do you have a history colorectal cancer?   no  Are you diabetic? If yes, Type 1 or Type 2?    no  Do you have a prosthetic or mechanical heart valve? no  Do you have a pacemaker/defibrillator?   no  Have you had endocarditis/atrial fibrillation? no  Have you had joint replacement within the last 12 months?  no  Do you tend to be constipated or have to use laxatives? no  Do you have any history of drugs or alchohol?  no  Do you use supplemental oxygen?  no  Have you had a stroke or heart attack within the last 6 months? no  Do you take weight loss medication?  no  For female patients: have you had a hysterectomy?  yes                                     are you post menopausal?                                                   do you still have your menstrual cycle? no      Do you take any blood-thinning medications such as: (aspirin, warfarin, Plavix, Aggrenox)  no  If yes we need the name, milligram, dosage and who is prescribing doctor  Current Outpatient Medications on File Prior to Visit  Medication Sig Dispense Refill   cetirizine (ZYRTEC) 10 MG chewable tablet Chew 10 mg by mouth daily.     Cholecalciferol (VITAMIN D) 50 MCG (2000 UT) CAPS Take by mouth.     estradiol (ESTRACE) 1 MG tablet Take 1 mg by mouth daily.     No current facility-administered medications on file prior to visit.    Allergies  Allergen Reactions   Keflex [Cephalexin] Rash   Penicillins Rash   Tylenol [Acetaminophen] Rash     Pharmacy: [facility]  Primary Insurance Name: [facility]  Best number where you can be reached: [phone]

## 2023-06-26 NOTE — Telephone Encounter (Signed)
Will call once October schedule is out

## 2023-07-12 NOTE — Telephone Encounter (Signed)
Called pt to schedule procedure. She wants an early AM in November. Advised will call once we receive that schedule

## 2023-07-19 DIAGNOSIS — H524 Presbyopia: Secondary | ICD-10-CM | POA: Diagnosis not present

## 2023-07-19 DIAGNOSIS — H43393 Other vitreous opacities, bilateral: Secondary | ICD-10-CM | POA: Diagnosis not present

## 2023-08-10 DIAGNOSIS — Z7189 Other specified counseling: Secondary | ICD-10-CM | POA: Diagnosis not present

## 2023-08-10 DIAGNOSIS — Z299 Encounter for prophylactic measures, unspecified: Secondary | ICD-10-CM | POA: Diagnosis not present

## 2023-08-10 DIAGNOSIS — R5383 Other fatigue: Secondary | ICD-10-CM | POA: Diagnosis not present

## 2023-08-10 DIAGNOSIS — E7800 Pure hypercholesterolemia, unspecified: Secondary | ICD-10-CM | POA: Diagnosis not present

## 2023-08-10 DIAGNOSIS — Z0000 Encounter for general adult medical examination without abnormal findings: Secondary | ICD-10-CM | POA: Diagnosis not present

## 2023-08-10 DIAGNOSIS — Z79899 Other long term (current) drug therapy: Secondary | ICD-10-CM | POA: Diagnosis not present

## 2023-08-27 ENCOUNTER — Encounter (INDEPENDENT_AMBULATORY_CARE_PROVIDER_SITE_OTHER): Payer: Self-pay | Admitting: *Deleted

## 2023-08-27 MED ORDER — PEG 3350-KCL-NA BICARB-NACL 420 G PO SOLR: 4000.0000 mL | Freq: Once | ORAL | 0 refills | Status: AC

## 2023-08-27 NOTE — Addendum Note (Signed)
Addended by: Marlowe Shores on: 08/27/2023 09:33 AM   Modules accepted: Orders

## 2023-08-27 NOTE — Telephone Encounter (Signed)
Pt left voicemail in regards to scheduling TCS. Returned call to patient. Pt is scheduled for 09/26/23 at 10:15am. Instructions mailed to patient. Prep sent to pharmacy.   Per [employer]: Notification or Prior Authorization is not required for the requested services You are not required to submit a notification/prior authorization based on the information provided. If you have general questions about the prior authorization requirements, visit [email or web address] > Clinician Resources > Advance and Admission Notification Requirements. The number above acknowledges your notification. Please write this reference number down for future reference. If you would like to request an organization determination, please call us at [phone]. Decision ID #: [id number]

## 2023-08-27 NOTE — Telephone Encounter (Signed)
Referral completed, TCS apt letter sent to PCP

## 2023-09-26 ENCOUNTER — Encounter (HOSPITAL_COMMUNITY)
Admission: RE | Disposition: A | Discharge status: Home / Self Care | Payer: Self-pay | Source: Home / Self Care | Attending: Gastroenterology

## 2023-09-26 ENCOUNTER — Ambulatory Visit (HOSPITAL_COMMUNITY): Payer: Medicare Other | Admitting: Anesthesiology

## 2023-09-26 ENCOUNTER — Other Ambulatory Visit: Payer: Self-pay

## 2023-09-26 ENCOUNTER — Ambulatory Visit (HOSPITAL_COMMUNITY)
Admission: RE | Admit: 2023-09-26 | Discharge: 2023-09-26 | Disposition: A | Discharge status: Home / Self Care | Payer: Medicare Other | Attending: Gastroenterology | Admitting: Gastroenterology

## 2023-09-26 ENCOUNTER — Encounter (HOSPITAL_COMMUNITY): Payer: Self-pay

## 2023-09-26 ENCOUNTER — Encounter (INDEPENDENT_AMBULATORY_CARE_PROVIDER_SITE_OTHER): Payer: Self-pay | Admitting: *Deleted

## 2023-09-26 DIAGNOSIS — K5730 Diverticulosis of large intestine without perforation or abscess without bleeding: Secondary | ICD-10-CM | POA: Diagnosis not present

## 2023-09-26 DIAGNOSIS — I10 Essential (primary) hypertension: Secondary | ICD-10-CM | POA: Diagnosis not present

## 2023-09-26 DIAGNOSIS — Z87891 Personal history of nicotine dependence: Secondary | ICD-10-CM | POA: Insufficient documentation

## 2023-09-26 DIAGNOSIS — K621 Rectal polyp: Secondary | ICD-10-CM

## 2023-09-26 DIAGNOSIS — Z1211 Encounter for screening for malignant neoplasm of colon: Secondary | ICD-10-CM | POA: Insufficient documentation

## 2023-09-26 DIAGNOSIS — K644 Residual hemorrhoidal skin tags: Secondary | ICD-10-CM | POA: Diagnosis not present

## 2023-09-26 DIAGNOSIS — Z139 Encounter for screening, unspecified: Secondary | ICD-10-CM | POA: Diagnosis not present

## 2023-09-26 DIAGNOSIS — K648 Other hemorrhoids: Secondary | ICD-10-CM | POA: Insufficient documentation

## 2023-09-26 HISTORY — PX: COLONOSCOPY WITH PROPOFOL: SHX5780

## 2023-09-26 HISTORY — PX: POLYPECTOMY: SHX5525

## 2023-09-26 LAB — HM COLONOSCOPY

## 2023-09-26 SURGERY — COLONOSCOPY WITH PROPOFOL
Anesthesia: General

## 2023-09-26 MED ORDER — LIDOCAINE HCL (CARDIAC) PF 100 MG/5ML IV SOSY
PREFILLED_SYRINGE | INTRAVENOUS | Status: DC | PRN
  Administered 2023-09-26: 50 mg via INTRATRACHEAL

## 2023-09-26 MED ORDER — LACTATED RINGERS IV SOLN: INTRAVENOUS | Status: DC

## 2023-09-26 MED ORDER — PROPOFOL 10 MG/ML IV BOLUS
INTRAVENOUS | Status: DC | PRN
  Administered 2023-09-26: 75 mg via INTRAVENOUS

## 2023-09-26 MED ORDER — STERILE WATER FOR IRRIGATION IR SOLN
Status: DC | PRN
  Administered 2023-09-26: 120 mL

## 2023-09-26 MED ORDER — PROPOFOL 500 MG/50ML IV EMUL
INTRAVENOUS | Status: DC | PRN
  Administered 2023-09-26: 150 ug/kg/min via INTRAVENOUS

## 2023-09-26 NOTE — Anesthesia Preprocedure Evaluation (Signed)

## 2023-09-26 NOTE — Op Note (Signed)
[facility] Patient Name: Terry Vance Procedure Date: 09/26/2023 9:40 AM MRN: [id number] Date of Birth: 1951-05-26 Attending MD: Sanjuan Dame , MD, [id number] CSN: [id number] Age: 72 Admit Type: Outpatient Procedure:                Colonoscopy Indications:              Screening for colorectal malignant neoplasm Providers:                Sanjuan Dame, MD, Edrick Kins, RN, Pandora Leiter, Technician Referring MD:              Medicines:                Monitored Anesthesia Care Complications:            No immediate complications. Estimated Blood Loss:     Estimated blood loss was minimal. Procedure:                Pre-Anesthesia Assessment:                           - Prior to the procedure, a History and Physical                            was performed, and patient medications and                            allergies were reviewed. The patient's tolerance of                            previous anesthesia was also reviewed. The risks                            and benefits of the procedure and the sedation                            options and risks were discussed with the patient.                            All questions were answered, and informed consent                            was obtained. Prior Anticoagulants: The patient has                            taken no anticoagulant or antiplatelet agents. ASA                            Grade Assessment: II - A patient with mild systemic                            disease. After reviewing the risks and benefits,  the patient was deemed in satisfactory condition to                            undergo the procedure.                           After obtaining informed consent, the colonoscope                            was passed under direct vision. Throughout the                            procedure, the patient's blood pressure, pulse, and                            oxygen  saturations were monitored continuously. The                            [id number]-[id number] ([id number]) scope was introduced through                            the anus and advanced to the the cecum, identified                            by appendiceal orifice and ileocecal valve. The                            colonoscopy was performed without difficulty. The                            patient tolerated the procedure well. The quality                            of the bowel preparation was evaluated using the                            BBPS [facility] Bowel Preparation Scale) with scores                            of: Right Colon = 3, Transverse Colon = 3 and Left                            Colon = 3 (entire mucosa seen well with no residual                            staining, small fragments of stool or opaque                            liquid). The total BBPS score equals 9. The                            ileocecal valve, appendiceal orifice, and rectum  were photographed. Scope In: 9:50:20 AM Scope Out: 10:07:30 AM Scope Withdrawal Time: 0 hours 14 minutes 2 seconds  Total Procedure Duration: 0 hours 17 minutes 10 seconds  Findings:      The perianal and digital rectal examinations were normal.      Two sessile polyps were found in the rectum. The polyps were 3 to 4 mm       in size. These polyps were removed with a cold snare. Resection and       retrieval were complete.      Scattered diverticula were found in the left colon.      Non-bleeding external and internal hemorrhoids were found during       retroflexion. The hemorrhoids were small. Impression:               - Two 3 to 4 mm polyps in the rectum, removed with                            a cold snare. Resected and retrieved.                           - Diverticulosis in the left colon.                           - Non-bleeding external and internal hemorrhoids. Moderate Sedation:      Per Anesthesia  Care Recommendation:           - Patient has a contact number available for                            emergencies. The signs and symptoms of potential                            delayed complications were discussed with the                            patient. Return to normal activities tomorrow.                            Written discharge instructions were provided to the                            patient.                           - Resume previous diet.                           - Continue present medications.                           - Await pathology results.                           - Repeat colonoscopy in 10 years for surveillance.                           - Return to primary care physician  as previously                            scheduled. Procedure Code(s):        --- Professional ---                           [phone], Colonoscopy, flexible; with removal of                            tumor(s), polyp(s), or other lesion(s) by snare                            technique Diagnosis Code(s):        --- Professional ---                           Z12.11, Encounter for screening for malignant                            neoplasm of colon                           D12.8, Benign neoplasm of rectum                           K64.8, Other hemorrhoids                           K57.30, Diverticulosis of large intestine without                            perforation or abscess without bleeding CPT copyright 2022 American Medical Association. All rights reserved. The codes documented in this report are preliminary and upon coder review may  be revised to meet current compliance requirements. Sanjuan Dame, MD Sanjuan Dame, MD 09/26/2023 10:10:51 AM This report has been signed electronically. Number of Addenda: 0

## 2023-09-26 NOTE — Transfer of Care (Signed)
Immediate Anesthesia Transfer of Care Note  Patient: Terry Vance  Procedure(s) Performed: COLONOSCOPY WITH PROPOFOL POLYPECTOMY  Patient Location: Endoscopy Unit  Anesthesia Type:General  Level of Consciousness: awake, alert , and oriented  Airway & Oxygen Therapy: Patient Spontanous Breathing  Post-op Assessment: Report given to RN and Post -op Vital signs reviewed and stable  Post vital signs: Reviewed and stable  Last Vitals:  Vitals Value Taken Time  BP    Temp    Pulse    Resp    SpO2      Last Pain:  Vitals:   09/26/23 0945  TempSrc:   PainSc: 0-No pain      Patients Stated Pain Goal: 5 (09/26/23 0857)  Complications: No notable events documented.

## 2023-09-26 NOTE — H&P (Signed)
Primary Care Physician:  Ignatius Specking, MD Primary Gastroenterologist:  Dr. Tasia Catchings  Pre-Procedure History & Physical: HPI:  Terry Vance is a 72 y.o. female is here for a colonoscopy for colon cancer screening purposes.  Patient denies any family history of colorectal cancer.  No melena or hematochezia.  No abdominal pain or unintentional weight loss.  No change in bowel habits.  Overall feels well from a GI standpoint.   Past Medical History:  Diagnosis Date   Abnormal Pap smear    several years ago   Mitral valve prolapse     Past Surgical History:  Procedure Laterality Date   ABDOMINAL HYSTERECTOMY     BUNIONECTOMY Bilateral    COLONOSCOPY     DILATATION & CURRETTAGE/HYSTEROSCOPY WITH RESECTOCOPE     DILATION AND CURETTAGE OF UTERUS      Prior to Admission medications   Medication Sig Start Date End Date Taking? Authorizing Provider  Cholecalciferol (VITAMIN D) 50 MCG (2000 UT) CAPS Take by mouth.   Yes [provider]  estradiol (ESTRACE) 1 MG tablet Take 2 mg by mouth daily.   Yes [provider]  loratadine (CLARITIN) 10 MG tablet Take 10 mg by mouth daily.   Yes [provider]  cetirizine (ZYRTEC) 10 MG chewable tablet Chew 10 mg by mouth daily.    [provider]    Allergies as of 08/30/2023 - Review Complete 01/30/2021  Allergen Reaction Noted   Keflex [cephalexin] Rash 08/23/2012   Penicillins Rash 08/23/2012   Tylenol [acetaminophen] Rash 08/23/2012    History reviewed. No pertinent family history.  Social History   Socioeconomic History   Marital status: Married    Spouse name: Not on file   Number of children: Not on file   Years of education: Not on file   Highest education level: Not on file  Occupational History   Not on file  Tobacco Use   Smoking status: Former    Current packs/day: 0.00    Types: Cigarettes    Quit date: 1980    Years since quitting: 44.9   Smokeless tobacco: Never  Vaping Use   Vaping  status: Never Used  Substance and Sexual Activity   Alcohol use: Yes    Alcohol/week: 7.0 standard drinks of alcohol    Types: 7 Glasses of wine per week    Comment: socally   Drug use: Never   Sexual activity: Not on file  Other Topics Concern   Not on file  Social History Narrative   Not on file   Social Determinants of Health   Financial Resource Strain: Not on file  Food Insecurity: Not on file  Transportation Needs: Not on file  Physical Activity: Not on file  Stress: Not on file  Social Connections: Not on file  Intimate Partner Violence: Not on file    Review of Systems: See HPI, otherwise negative ROS  Physical Exam: Vital signs in last 24 hours: Temp:  [98.1 F (36.7 C)] 98.1 F (36.7 C) (12/11 0906) Pulse Rate:  [85] 85 (12/11 0906) Resp:  [10] 10 (12/11 0906) BP: (128)/(72) 128/72 (12/11 0906) SpO2:  [96 %] 96 % (12/11 0906) Weight:  [49.9 kg] 49.9 kg (12/11 0857)   General:   Alert,  Well-developed, well-nourished, pleasant and cooperative in NAD Head:  Normocephalic and atraumatic. Eyes:  Sclera clear, no icterus.   Conjunctiva pink. Ears:  Normal auditory acuity. Nose:  No deformity, discharge,  or lesions. Msk:  Symmetrical without  gross deformities. Normal posture. Extremities:  Without clubbing or edema. Neurologic:  Alert and  oriented x4;  grossly normal neurologically. Skin:  Intact without significant lesions or rashes. Psych:  Alert and cooperative. Normal mood and affect.  Impression/Plan: Terry Vance is here for a colonoscopy to be performed for colon cancer screening purposes.  The risks of the procedure including infection, bleed, or perforation as well as benefits, limitations, alternatives and imponderables have been reviewed with the patient. Questions have been answered. All parties agreeable.

## 2023-09-26 NOTE — Discharge Instructions (Signed)

## 2023-09-27 LAB — SURGICAL PATHOLOGY

## 2023-09-27 NOTE — Anesthesia Postprocedure Evaluation (Signed)
Anesthesia Post Note  Patient: Terry Vance  Procedure(s) Performed: COLONOSCOPY WITH PROPOFOL POLYPECTOMY  Patient location during evaluation: Phase II Anesthesia Type: General Level of consciousness: awake Pain management: pain level controlled Vital Signs Assessment: post-procedure vital signs reviewed and stable Respiratory status: spontaneous breathing and respiratory function stable Cardiovascular status: blood pressure returned to baseline and stable Postop Assessment: no headache and no apparent nausea or vomiting Anesthetic complications: no Comments: Late entry   No notable events documented.   Last Vitals:  Vitals:   09/26/23 0906 09/26/23 1010  BP: 128/72 106/72  Pulse: 85 88  Resp: 10 17  Temp: 36.7 C (!) 36.4 C  SpO2: 96% 98%    Last Pain:  Vitals:   09/26/23 1010  TempSrc: Oral  PainSc:                  Windell Norfolk

## 2023-10-01 NOTE — Progress Notes (Signed)
I reviewed the pathology results. Ann, can you send her a letter with the findings as described below please? Repeat colonoscopy in 10 years  Thanks,  Terry Lawman, MD Gastroenterology and Hepatology [facility]  ---------------------------------------------------------------------------------------------  [name] [address], [address], [place], [place] [address] Phone:  [phone]   10/01/23 [name], [place]   Dear Terry Vance,  I am writing to inform you that the biopsies taken during your recent endoscopic examination showed:  I am writing to let you know the results of your recent colonoscopy.  You had a total of 2 polyps removed. The pathology came back as "hyperplastic polyp." These findings are NOT cancer and do not turn into cancer.  Given these findings, it is recommended that your next colonoscopy be performed in 10 years. As per [place]  Multi-Society Task Force on Colorectal Cancer recommendation; For individuals ages 22 to 93, the decision to start or continue screening should be individualized.   Also I value your feedback , so if you get a survey , please take the time to fill it out and thank you for choosing [facility]  Please call us at [phone] if you have persistent problems or have questions about your condition that have not been fully answered at this time.  Sincerely,  Terry Lawman, MD Gastroenterology and Hepatology

## 2023-10-02 ENCOUNTER — Encounter (INDEPENDENT_AMBULATORY_CARE_PROVIDER_SITE_OTHER): Payer: Self-pay | Admitting: *Deleted

## 2023-10-02 DIAGNOSIS — Z299 Encounter for prophylactic measures, unspecified: Secondary | ICD-10-CM | POA: Diagnosis not present

## 2023-10-02 DIAGNOSIS — J329 Chronic sinusitis, unspecified: Secondary | ICD-10-CM | POA: Diagnosis not present

## 2023-10-03 ENCOUNTER — Encounter (HOSPITAL_COMMUNITY): Payer: Self-pay | Admitting: Gastroenterology

## 2023-10-22 DIAGNOSIS — M818 Other osteoporosis without current pathological fracture: Secondary | ICD-10-CM | POA: Diagnosis not present

## 2023-11-07 DIAGNOSIS — R5383 Other fatigue: Secondary | ICD-10-CM | POA: Diagnosis not present

## 2023-11-07 DIAGNOSIS — Z299 Encounter for prophylactic measures, unspecified: Secondary | ICD-10-CM | POA: Diagnosis not present

## 2023-11-07 DIAGNOSIS — E7800 Pure hypercholesterolemia, unspecified: Secondary | ICD-10-CM | POA: Diagnosis not present

## 2023-11-09 ENCOUNTER — Encounter (HOSPITAL_COMMUNITY): Payer: Self-pay | Admitting: Emergency Medicine

## 2023-11-09 ENCOUNTER — Emergency Department (HOSPITAL_COMMUNITY)
Admission: EM | Admit: 2023-11-09 | Discharge: 2023-11-10 | Disposition: A | Discharge status: Home / Self Care | Payer: Medicare Other | Attending: Student | Admitting: Student

## 2023-11-09 ENCOUNTER — Emergency Department (HOSPITAL_COMMUNITY): Payer: Medicare Other

## 2023-11-09 ENCOUNTER — Other Ambulatory Visit: Payer: Self-pay

## 2023-11-09 DIAGNOSIS — R0789 Other chest pain: Secondary | ICD-10-CM | POA: Insufficient documentation

## 2023-11-09 DIAGNOSIS — R079 Chest pain, unspecified: Secondary | ICD-10-CM | POA: Diagnosis not present

## 2023-11-09 NOTE — ED Triage Notes (Addendum)
Pt with c/o L sided chest pain x 1 hr after eating dinner. Pt states pain radiates down L arm and around to L upper back.  States pain is worse with deep breath.

## 2023-11-10 DIAGNOSIS — R079 Chest pain, unspecified: Secondary | ICD-10-CM | POA: Diagnosis not present

## 2023-11-10 LAB — CBC
HCT: 39.7 % (ref 36.0–46.0)
Hemoglobin: 12.5 g/dL (ref 12.0–15.0)
MCH: 27.3 pg (ref 26.0–34.0)
MCHC: 31.5 g/dL (ref 30.0–36.0)
MCV: 86.7 fL (ref 80.0–100.0)
Platelets: 284 10*3/uL (ref 150–400)
RBC: 4.58 MIL/uL (ref 3.87–5.11)
RDW: 12.9 % (ref 11.5–15.5)
WBC: 8.4 10*3/uL (ref 4.0–10.5)
nRBC: 0 % (ref 0.0–0.2)

## 2023-11-10 LAB — BASIC METABOLIC PANEL
Anion gap: 11 (ref 5–15)
BUN: 9 mg/dL (ref 8–23)
CO2: 22 mmol/L (ref 22–32)
Calcium: 8.8 mg/dL — ABNORMAL LOW (ref 8.9–10.3)
Chloride: 105 mmol/L (ref 98–111)
Creatinine, Ser: 0.47 mg/dL (ref 0.44–1.00)
GFR, Estimated: >60 mL/min (ref >60)
Glucose, Bld: 92 mg/dL (ref 70–99)
Potassium: 3.4 mmol/L — ABNORMAL LOW (ref 3.5–5.1)
Sodium: 138 mmol/L (ref 135–145)

## 2023-11-10 LAB — D-DIMER, QUANTITATIVE: D-Dimer, Quant: <0.27 ug{FEU}/mL (ref 0.00–0.50)

## 2023-11-10 LAB — TROPONIN I (HIGH SENSITIVITY)
Troponin I (High Sensitivity): 2 ng/L (ref <18)
Troponin I (High Sensitivity): 3 ng/L (ref <18)

## 2023-11-10 NOTE — ED Provider Notes (Signed)
[place] EMERGENCY DEPARTMENT AT [facility] Provider Note  CSN: [id number] Arrival date & time: 11/09/23 2325  Chief Complaint(s) Chest Pain  HPI Terry Vance is a 73 y.o. female with PMH MVP who presents emergency room for evaluation of chest pain.  She states that she ate dinner and had a glass of wine this evening.  Went home and had a sudden onset sensation of sharp chest pain under the left breast that radiates to the left back and down the left arm.  Endorsing some mild pleurisy.  Denies associated diaphoresis, nausea, vomiting, shortness of breath or other systemic symptoms.  No exertional component to the pain.  On arrival to the emergency department, she states that symptoms are significant proved from previous and are more of a lingering soreness at this time.   Past Medical History Past Medical History:  Diagnosis Date   Abnormal Pap smear    several years ago   Mitral valve prolapse    Patient Active Problem List   Diagnosis Date Noted   Rectal polyp 09/26/2023   Home Medication(s) Prior to Admission medications   Medication Sig Start Date End Date Taking? Authorizing Provider  cetirizine (ZYRTEC) 10 MG chewable tablet Chew 10 mg by mouth daily.    [provider]  Cholecalciferol (VITAMIN D) 50 MCG (2000 UT) CAPS Take by mouth.    [provider]  estradiol (ESTRACE) 1 MG tablet Take 2 mg by mouth daily.    [provider]  loratadine (CLARITIN) 10 MG tablet Take 10 mg by mouth daily.    [provider]                                                                                                                                    Past Surgical History Past Surgical History:  Procedure Laterality Date   ABDOMINAL HYSTERECTOMY     BUNIONECTOMY Bilateral    COLONOSCOPY     COLONOSCOPY WITH PROPOFOL N/A 09/26/2023   Procedure: COLONOSCOPY WITH PROPOFOL;  Surgeon: Franky Macho, MD;  Location: [facility] ENDO SU[facility];   Service: Endoscopy;  Laterality: N/A;  10:35am;asa 2   DILATATION & CURRETTAGE/HYSTEROSCOPY WITH RESECTOCOPE     DILATION AND CURETTAGE OF UTERUS     POLYPECTOMY  09/26/2023   Procedure: POLYPECTOMY;  Surgeon: Franky Macho, MD;  Location: [facility]O SUITE;  Service: Endoscopy;;   Family History History reviewed. No pertinent family history.  Social History Social History   Tobacco Use   Smoking status: Former    Current packs/day: 0.00    Types: Cigarettes    Quit date: 1980    Years since quitting: 45.0   Smokeless tobacco: Never  Vaping Use   Vaping status: Never Used  Substance Use Topics   Alcohol use: Yes    Alcohol/week: 7.0 standard drinks of alcohol    Types: 7 Glasses of wine per week  Comment: socally   Drug use: Never   Allergies Keflex [cephalexin], Penicillins, and Tylenol [acetaminophen]  Review of Systems Review of Systems  Cardiovascular:  Positive for chest pain.    Physical Exam Vital Signs  I have reviewed the triage vital signs BP 117/72   Pulse 74   Temp 97.7 F (36.5 C) (Oral)   Resp 15   Ht 5\' 1"  (1.549 m)   Wt 49.4 kg   SpO2 98%   BMI 20.60 kg/m   Physical Exam Vitals and nursing note reviewed.  Constitutional:      General: She is not in acute distress.    Appearance: She is well-developed.  HENT:     Head: Normocephalic and atraumatic.  Eyes:     Conjunctiva/sclera: Conjunctivae normal.  Cardiovascular:     Rate and Rhythm: Normal rate and regular rhythm.     Heart sounds: No murmur heard. Pulmonary:     Effort: Pulmonary effort is normal. No respiratory distress.     Breath sounds: Normal breath sounds.  Chest:     Chest wall: Tenderness present.  Abdominal:     Palpations: Abdomen is soft.     Tenderness: There is no abdominal tenderness.  Musculoskeletal:        General: No swelling.     Cervical back: Neck supple.  Skin:    General: Skin is warm and dry.     Capillary Refill: Capillary refill takes less than  2 seconds.  Neurological:     Mental Status: She is alert.  Psychiatric:        Mood and Affect: Mood normal.     ED Results and Treatments Labs (all labs ordered are listed, but only abnormal results are displayed) Labs Reviewed  BASIC METABOLIC PANEL - Abnormal; Notable for the following components:      Result Value   Potassium 3.4 (*)    Calcium 8.8 (*)    All other components within normal limits  CBC  D-DIMER, QUANTITATIVE  TROPONIN I (HIGH SENSITIVITY)  TROPONIN I (HIGH SENSITIVITY)                                                                                                                          Radiology DG Chest 2 View Result Date: 11/10/2023 CLINICAL DATA:  Chest pain EXAM: CHEST - 2 VIEW COMPARISON:  None Available. FINDINGS: The heart size and mediastinal contours are within normal limits. Both lungs are clear. The visualized skeletal structures are unremarkable. IMPRESSION: No active cardiopulmonary disease. Electronically Signed   By: Darliss Cheney M.D.   On: 11/10/2023 00:09    Pertinent labs & imaging results that were available during my care of the patient were reviewed by me and considered in my medical decision making (see MDM for details).  Medications Ordered in ED Medications - No data to display  Procedures Procedures  (including critical care time)  Medical Decision Making / ED Course   This patient presents to the ED for concern of chest pain, this involves an extensive number of treatment options, and is a complaint that carries with it a high risk of complications and morbidity.  The differential diagnosis includes ACS, Aortic Dissection, Pneumothorax, Pneumonia, Esophageal Rupture, PE, Tamponade/Pericardial Effusion, pericarditis, esophageal spasm, dysrhythmia, GERD, costochondritis.  MDM: Patient seen emergency  room for evaluation of chest pain.  Physical exam with some mild reproducible tenderness under the left breast and over the sternum but is otherwise unremarkable.  Laboratory evaluation with some mild hypokalemia to 3.4 but is otherwise unremarkable.  High-sensitivity troponin and delta troponin are negative.  D-dimer negative.  Chest x-ray unremarkable.  ECG nonischemic.  Heart score is low and I have lower suspicion for ACS.  Low suspicion for PE with negative D-dimer.  On reevaluation, symptoms continuing to improve and at this time with symptoms resolved and negative cardiac workup she does not meet inpatient criteria for admission and will be discharged with outpatient follow-up.  Return precautions given which she voiced understanding.  Outpatient cardiology referral placed   Additional history obtained: -Additional history obtained from husband -External records from outside source obtained and reviewed including: Chart review including previous notes, labs, imaging, consultation notes   Lab Tests: -I ordered, reviewed, and interpreted labs.   The pertinent results include:   Labs Reviewed  BASIC METABOLIC PANEL - Abnormal; Notable for the following components:      Result Value   Potassium 3.4 (*)    Calcium 8.8 (*)    All other components within normal limits  CBC  D-DIMER, QUANTITATIVE  TROPONIN I (HIGH SENSITIVITY)  TROPONIN I (HIGH SENSITIVITY)      EKG   EKG Interpretation Date/Time:  Friday November 09 2023 23:35:15 EST Ventricular Rate:  75 PR Interval:  184 QRS Duration:  78 QT Interval:  376 QTC Calculation: 419 R Axis:   -6  Text Interpretation: Normal sinus rhythm No previous ECGs available Confirmed by Zuly Belkin ([id number]) on 11/10/2023 12:54:25 AM         Imaging Studies ordered: I ordered imaging studies including chest x-ray I independently visualized and interpreted imaging. I agree with the radiologist interpretation   Medicines ordered and  prescription drug management: No orders of the defined types were placed in this encounter.   -I have reviewed the patients home medicines and have made adjustments as needed  Critical interventions none    Cardiac Monitoring: The patient was maintained on a cardiac monitor.  I personally viewed and interpreted the cardiac monitored which showed an underlying rhythm of: NSR  Social Determinants of Health:  Factors impacting patients care include: none   Reevaluation: After the interventions noted above, I reevaluated the patient and found that they have :improved  Co morbidities that complicate the patient evaluation  Past Medical History:  Diagnosis Date   Abnormal Pap smear    several years ago   Mitral valve prolapse       Dispostion: I considered admission for this patient, but at this time she does not meet inpatient criteria for admission and will be discharged with outpatient follow-up     Final Clinical Impression(s) / ED Diagnoses Final diagnoses:  Atypical chest pain     @PCDICTATION @    Derel Mcglasson, Wyn Forster, MD 11/10/23 [phone]

## 2023-11-20 ENCOUNTER — Ambulatory Visit: Payer: Medicare Other | Attending: Internal Medicine | Admitting: Internal Medicine

## 2023-11-20 ENCOUNTER — Telehealth: Payer: Self-pay | Admitting: Internal Medicine

## 2023-11-20 ENCOUNTER — Encounter: Payer: Self-pay | Admitting: Internal Medicine

## 2023-11-20 VITALS — BP 115/72 | HR 93 | Ht 61.0 in | Wt 110.4 lb

## 2023-11-20 DIAGNOSIS — R0789 Other chest pain: Secondary | ICD-10-CM | POA: Diagnosis not present

## 2023-11-20 DIAGNOSIS — R079 Chest pain, unspecified: Secondary | ICD-10-CM | POA: Diagnosis not present

## 2023-11-20 NOTE — Progress Notes (Signed)
 Cardiology Office Note  Date: 11/20/2023   ID: DONALYN SCHNEEBERGER, DOB [birth year], MRN [id number]  PCP:  Rosamond Leta NOVAK, MD  Cardiologist:  None Electrophysiologist:  None   History of Present Illness: Terry Vance is a 73 y.o. female known to have self-reported mitral valve prolapse was referred to cardiology clinic post ER visit for chest pain.  Patient started to have pain in the left side of her chest radiating to her left shoulder prompting ER visit in January 2024.  Since then, she had a few episodes of similar quality of chest discomfort.  All these episodes occur at rest.  No chest pain with exertion.  She underwent mammogram last year, in August 2024 that showed benign cyst in the left breast measuring 0.8 mm, 5 cm away from the nipple.  She also reported when she elevates her left arm, she does feel some nagging sensation in her left breast.  No other symptoms of dizziness, syncope, palpitations, leg swelling, DOE.  Past Medical History:  Diagnosis Date   Abnormal Pap smear    several years ago   Mitral valve prolapse     Past Surgical History:  Procedure Laterality Date   ABDOMINAL HYSTERECTOMY     BUNIONECTOMY Bilateral    COLONOSCOPY     COLONOSCOPY WITH PROPOFOL  N/A 09/26/2023   Procedure: COLONOSCOPY WITH PROPOFOL ;  Surgeon: Cinderella Deatrice FALCON, MD;  Location: [facility];  Service: Endoscopy;  Laterality: N/A;  10:35am;asa 2   DILATATION & CURRETTAGE/HYSTEROSCOPY WITH RESECTOCOPE     DILATION AND CURETTAGE OF UTERUS     POLYPECTOMY  09/26/2023   Procedure: POLYPECTOMY;  Surgeon: Cinderella Deatrice FALCON, MD;  Location: [facility];  Service: Endoscopy;;    Current Outpatient Medications  Medication Sig Dispense Refill   cetirizine (ZYRTEC) 10 MG chewable tablet Chew 10 mg by mouth daily.     Cholecalciferol (VITAMIN D) 50 MCG (2000 UT) CAPS Take by mouth.     estradiol (ESTRACE) 1 MG tablet Take 2 mg by mouth daily.     loratadine (CLARITIN) 10 MG tablet Take 10 mg by  mouth daily.     No current facility-administered medications for this visit.   Allergies:  Keflex [cephalexin], Penicillins, and Tylenol [acetaminophen]   Social History: The patient  reports that she quit smoking about 45 years ago. Her smoking use included cigarettes. She has never used smokeless tobacco. She reports current alcohol  use of about 7.0 standard drinks of alcohol  per week. She reports that she does not use drugs.   Family History: The patient's family history is not on file.   ROS:  Please see the history of present illness. Otherwise, complete review of systems is positive for none  All other systems are reviewed and negative.   Physical Exam: VS:  There were no vitals taken for this visit., BMI There is no height or weight on file to calculate BMI.  Wt Readings from Last 3 Encounters:  11/09/23 109 lb (49.4 kg)  09/26/23 110 lb (49.9 kg)    General: Patient appears comfortable at rest. HEENT: Conjunctiva and lids normal, oropharynx clear with moist mucosa. Neck: Supple, no elevated JVP or carotid bruits, no thyromegaly. Lungs: Clear to auscultation, nonlabored breathing at rest. Cardiac: Regular rate and rhythm, no S3 or significant systolic murmur, no pericardial rub. Abdomen: Soft, nontender, no hepatomegaly, bowel sounds present, no guarding or rebound. Extremities: No pitting edema, distal pulses 2+. Skin: Warm and dry. Musculoskeletal: No kyphosis. Neuropsychiatric:  Alert and oriented x3, affect grossly appropriate.  Recent Labwork: 11/10/2023: BUN 9; Creatinine, Ser 0.47; Hemoglobin 12.5; Platelets 284; Potassium 3.4; Sodium 138  No results found for: CHOL, TRIG, HDL, CHOLHDL, VLDL, LDLCALC, LDLDIRECT    Assessment and Plan:   Atypical chest pain : Patient had pain in the left side radiating to the left shoulder prompting ER visit in January 2024 and since then she had a few similar episodes.  All these episodes occurred at rest and not with  exertion.  EKG, troponins are within normal limits.  D-dimer within normal limits.  She had a benign cyst in her left breast diagnosed mammogram in August 2024.  Chest discomfort could like to be secondary to the cyst but cannot rule out CAD.  Cardiac risk factors include advanced age.  Obtain echocardiogram and exercise Myoview .      Medication Adjustments/Labs and Tests Ordered: Current medicines are reviewed at length with the patient today.  Concerns regarding medicines are outlined above.    Disposition:  Follow up  pending results  Signed Endia Moncur Priya Sundus Pete, MD, 11/20/2023 2:23 PM    [facility] at [facility] [address] [place], [place], [place] [address]

## 2023-11-20 NOTE — Telephone Encounter (Signed)
Checking percert on the following patient for testing scheduled at [facility].    EXERCISE STRESS --   11/28/2023

## 2023-11-20 NOTE — Patient Instructions (Addendum)
 Medication Instructions:  Your physician recommends that you continue on your current medications as directed. Please refer to the Current Medication list given to you today.   Labwork: None  Testing/Procedures: Your physician has requested that you have an echocardiogram. Echocardiography is a painless test that uses sound waves to create images of your heart. It provides your doctor with information about the size and shape of your heart and how well your heart's chambers and valves are working. This procedure takes approximately one hour. There are no restrictions for this procedure. Please do NOT wear cologne, perfume, aftershave, or lotions (deodorant is allowed). Please arrive 15 minutes prior to your appointment time.  Please note: We ask at that you not bring children with you during ultrasound (echo/ vascular) testing. Due to room size and safety concerns, children are not allowed in the ultrasound rooms during exams. Our front office staff cannot provide observation of children in our lobby area while testing is being conducted. An adult accompanying a patient to their appointment will only be allowed in the ultrasound room at the discretion of the ultrasound technician under special circumstances. We apologize for any inconvenience.  Your physician has requested that you have en exercise stress myoview. For further information please visit [email or web address] Please follow instruction sheet, as given.   Follow-Up: Your physician recommends that you schedule a follow-up appointment in: Pending Results  Any Other Special Instructions Will Be Listed Below (If Applicable). Thank you for choosing [facility]!     If you need a refill on your cardiac medications before your next appointment, please call your pharmacy.

## 2023-11-27 ENCOUNTER — Ambulatory Visit: Payer: Medicare Other | Attending: Internal Medicine

## 2023-11-27 DIAGNOSIS — R079 Chest pain, unspecified: Secondary | ICD-10-CM | POA: Diagnosis not present

## 2023-11-27 LAB — ECHOCARDIOGRAM COMPLETE
AR max vel: 2.37 cm2
AV Area VTI: 2.23 cm2
AV Area mean vel: 2.27 cm2
AV Mean grad: 6 mm[Hg]
AV Peak grad: 11 mm[Hg]
AV Vena cont: 0.8 cm
Ao pk vel: 1.66 m/s
Area-P 1/2: 3.48 cm2
Calc EF: 58.5 %
MV VTI: 3.95 cm2
P 1/2 time: 568 ms
S' Lateral: 2.3 cm
Single Plane A2C EF: 53.6 %
Single Plane A4C EF: 64.4 %

## 2023-11-28 ENCOUNTER — Encounter (HOSPITAL_COMMUNITY)
Admission: RE | Admit: 2023-11-28 | Discharge: 2023-11-28 | Disposition: A | Discharge status: Home / Self Care | Payer: Medicare Other | Source: Ambulatory Visit | Attending: Internal Medicine | Admitting: Internal Medicine

## 2023-11-28 ENCOUNTER — Ambulatory Visit (HOSPITAL_COMMUNITY)
Admission: RE | Admit: 2023-11-28 | Discharge: 2023-11-28 | Disposition: A | Discharge status: Home / Self Care | Payer: Medicare Other | Source: Ambulatory Visit | Attending: Internal Medicine | Admitting: Internal Medicine

## 2023-11-28 ENCOUNTER — Encounter (HOSPITAL_COMMUNITY): Payer: Self-pay

## 2023-11-28 DIAGNOSIS — R079 Chest pain, unspecified: Secondary | ICD-10-CM | POA: Insufficient documentation

## 2023-11-28 LAB — NM MYOCAR MULTI W/SPECT W/WALL MOTION / EF
Angina Index: 0
Duke Treadmill Score: 4
Estimated workload: 5.7
Exercise duration (min): 3 min
Exercise duration (sec): 39 s
LV dias vol: 62 mL (ref 46–106)
LV sys vol: 9 mL
MPHR: 150 {beats}/min
Nuc Stress EF: 85 %
Peak HR: 150 {beats}/min
Percent HR: 88 %
RATE: 0.4
Rest HR: 76 {beats}/min
Rest Nuclear Isotope Dose: 10.6 mCi
SDS: 1
SRS: 1
SSS: 2
ST Depression (mm): 0 mm
Stress Nuclear Isotope Dose: 30.8 mCi
TID: 1

## 2023-11-28 MED ORDER — TECHNETIUM TC 99M TETROFOSMIN IV KIT
30.0000 | PACK | Freq: Once | INTRAVENOUS | Status: AC | PRN
  Administered 2023-11-28: 30.8 via INTRAVENOUS

## 2023-11-28 MED ORDER — REGADENOSON 0.4 MG/5ML IV SOLN
INTRAVENOUS | Status: AC
  Filled 2023-11-28: qty 5

## 2023-11-28 MED ORDER — TECHNETIUM TC 99M TETROFOSMIN IV KIT
10.0000 | PACK | Freq: Once | INTRAVENOUS | Status: AC | PRN
  Administered 2023-11-28: 10.6 via INTRAVENOUS

## 2024-01-21 ENCOUNTER — Ambulatory Visit: Payer: Medicare Other | Admitting: Internal Medicine

## 2024-02-06 DIAGNOSIS — Z299 Encounter for prophylactic measures, unspecified: Secondary | ICD-10-CM | POA: Diagnosis not present

## 2024-02-06 DIAGNOSIS — M25512 Pain in left shoulder: Secondary | ICD-10-CM | POA: Diagnosis not present

## 2024-02-06 DIAGNOSIS — E7800 Pure hypercholesterolemia, unspecified: Secondary | ICD-10-CM | POA: Diagnosis not present

## 2024-02-25 ENCOUNTER — Ambulatory Visit: Payer: Medicare Other | Admitting: Internal Medicine

## 2024-06-04 DIAGNOSIS — N3281 Overactive bladder: Secondary | ICD-10-CM | POA: Diagnosis not present

## 2024-06-04 DIAGNOSIS — Z6820 Body mass index (BMI) 20.0-20.9, adult: Secondary | ICD-10-CM | POA: Diagnosis not present

## 2024-06-04 DIAGNOSIS — Z1231 Encounter for screening mammogram for malignant neoplasm of breast: Secondary | ICD-10-CM | POA: Diagnosis not present

## 2024-06-04 DIAGNOSIS — Z760 Encounter for issue of repeat prescription: Secondary | ICD-10-CM | POA: Diagnosis not present

## 2024-07-22 DIAGNOSIS — H524 Presbyopia: Secondary | ICD-10-CM | POA: Diagnosis not present

## 2024-07-22 DIAGNOSIS — H43391 Other vitreous opacities, right eye: Secondary | ICD-10-CM | POA: Diagnosis not present
